# Patient Record
Sex: Female | Born: 1940 | Race: White | Hispanic: No | Marital: Married | Smoking: Never smoker
Health system: Southern US, Community
[De-identification: ages and names within clinical notes are randomized; demographics above are authoritative.]

## PROBLEM LIST (undated history)

## (undated) DIAGNOSIS — R0600 Dyspnea, unspecified: Secondary | ICD-10-CM

## (undated) DIAGNOSIS — F329 Major depressive disorder, single episode, unspecified: Secondary | ICD-10-CM

## (undated) DIAGNOSIS — R413 Other amnesia: Secondary | ICD-10-CM

## (undated) DIAGNOSIS — I671 Cerebral aneurysm, nonruptured: Secondary | ICD-10-CM

## (undated) DIAGNOSIS — M1990 Unspecified osteoarthritis, unspecified site: Secondary | ICD-10-CM

## (undated) DIAGNOSIS — C801 Malignant (primary) neoplasm, unspecified: Secondary | ICD-10-CM

## (undated) DIAGNOSIS — F32A Depression, unspecified: Secondary | ICD-10-CM

## (undated) DIAGNOSIS — I499 Cardiac arrhythmia, unspecified: Secondary | ICD-10-CM

## (undated) DIAGNOSIS — G4730 Sleep apnea, unspecified: Secondary | ICD-10-CM

## (undated) DIAGNOSIS — R011 Cardiac murmur, unspecified: Secondary | ICD-10-CM

## (undated) DIAGNOSIS — K219 Gastro-esophageal reflux disease without esophagitis: Secondary | ICD-10-CM

## (undated) DIAGNOSIS — M810 Age-related osteoporosis without current pathological fracture: Secondary | ICD-10-CM

## (undated) DIAGNOSIS — M2041 Other hammer toe(s) (acquired), right foot: Secondary | ICD-10-CM

## (undated) DIAGNOSIS — I10 Essential (primary) hypertension: Secondary | ICD-10-CM

## (undated) DIAGNOSIS — I2510 Atherosclerotic heart disease of native coronary artery without angina pectoris: Secondary | ICD-10-CM

## (undated) DIAGNOSIS — E538 Deficiency of other specified B group vitamins: Secondary | ICD-10-CM

## (undated) HISTORY — PX: HEMORROIDECTOMY: SUR656

## (undated) HISTORY — PX: OTHER SURGICAL HISTORY: SHX169

## (undated) HISTORY — PX: BREAST SURGERY: SHX581

## (undated) HISTORY — PX: ABDOMINAL HYSTERECTOMY: SHX81

## (undated) HISTORY — PX: BLADDER SURGERY: SHX569

## (undated) HISTORY — PX: BRAIN SURGERY: SHX531

## (undated) HISTORY — PX: FOOT SURGERY: SHX648

## (undated) HISTORY — PX: SALPINGOOPHORECTOMY: SHX82

## (undated) HISTORY — PX: APPENDECTOMY: SHX54

## (undated) HISTORY — PX: CHOLECYSTECTOMY: SHX55

## (undated) HISTORY — PX: TONSILLECTOMY: SUR1361

---
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
## 1970-01-31 HISTORY — PX: ABDOMINAL HYSTERECTOMY: SHX81

## 2000-02-01 HISTORY — PX: BREAST SURGERY: SHX581

## 2002-12-29 ENCOUNTER — Other Ambulatory Visit: Payer: Self-pay

## 2003-08-01 ENCOUNTER — Other Ambulatory Visit: Payer: Self-pay

## 2004-02-01 HISTORY — PX: OTHER SURGICAL HISTORY: SHX169

## 2004-02-09 ENCOUNTER — Ambulatory Visit: Payer: Self-pay | Admitting: Unknown Physician Specialty

## 2004-03-22 ENCOUNTER — Ambulatory Visit: Payer: Self-pay | Admitting: Unknown Physician Specialty

## 2004-04-15 ENCOUNTER — Encounter: Payer: Self-pay | Admitting: Internal Medicine

## 2004-05-01 ENCOUNTER — Encounter: Payer: Self-pay | Admitting: Internal Medicine

## 2004-11-11 ENCOUNTER — Ambulatory Visit: Payer: Self-pay | Admitting: Internal Medicine

## 2008-12-16 ENCOUNTER — Inpatient Hospital Stay: Payer: Self-pay | Admitting: Internal Medicine

## 2009-01-01 ENCOUNTER — Ambulatory Visit: Payer: Self-pay | Admitting: Internal Medicine

## 2009-01-31 HISTORY — PX: BLADDER SURGERY: SHX569

## 2009-02-02 ENCOUNTER — Ambulatory Visit: Payer: Self-pay | Admitting: Unknown Physician Specialty

## 2009-02-11 ENCOUNTER — Ambulatory Visit: Payer: Self-pay | Admitting: Unknown Physician Specialty

## 2009-03-27 ENCOUNTER — Ambulatory Visit: Payer: Self-pay | Admitting: Unknown Physician Specialty

## 2010-02-10 ENCOUNTER — Ambulatory Visit: Payer: Self-pay | Admitting: Internal Medicine

## 2011-12-23 ENCOUNTER — Inpatient Hospital Stay: Payer: Self-pay | Admitting: Family Medicine

## 2011-12-23 LAB — BASIC METABOLIC PANEL
Calcium, Total: 9.3 mg/dL (ref 8.5–10.1)
Co2: 27 mmol/L (ref 21–32)
Creatinine: 0.82 mg/dL (ref 0.60–1.30)
EGFR (African American): >60
EGFR (Non-African Amer.): >60
Glucose: 137 mg/dL — ABNORMAL HIGH (ref 65–99)
Potassium: 3.6 mmol/L (ref 3.5–5.1)
Sodium: 140 mmol/L (ref 136–145)

## 2011-12-23 LAB — CBC
HGB: 13.1 g/dL (ref 12.0–16.0)
MCH: 31 pg (ref 26.0–34.0)
MCHC: 33 g/dL (ref 32.0–36.0)

## 2011-12-23 LAB — TROPONIN I: Troponin-I: <0.02 ng/mL

## 2011-12-24 LAB — CK TOTAL AND CKMB (NOT AT ARMC)
CK, Total: 229 U/L — ABNORMAL HIGH (ref 21–215)
CK-MB: 2.4 ng/mL (ref 0.5–3.6)

## 2013-01-22 ENCOUNTER — Ambulatory Visit: Payer: Self-pay | Admitting: Unknown Physician Specialty

## 2013-02-27 ENCOUNTER — Ambulatory Visit: Payer: Self-pay | Admitting: Unknown Physician Specialty

## 2013-03-01 LAB — PATHOLOGY REPORT

## 2013-04-23 ENCOUNTER — Emergency Department: Payer: Self-pay | Admitting: Emergency Medicine

## 2013-07-07 DIAGNOSIS — I10 Essential (primary) hypertension: Secondary | ICD-10-CM | POA: Diagnosis present

## 2014-05-16 ENCOUNTER — Inpatient Hospital Stay
Admit: 2014-05-16 | Disposition: A | Discharge status: Home / Self Care | Payer: Self-pay | Attending: Internal Medicine | Admitting: Internal Medicine

## 2014-05-16 LAB — URINALYSIS, COMPLETE
BACTERIA: NONE SEEN
BLOOD: NEGATIVE
Bilirubin,UR: NEGATIVE
Glucose,UR: NEGATIVE mg/dL (ref 0–75)
KETONE: NEGATIVE
LEUKOCYTE ESTERASE: NEGATIVE
NITRITE: NEGATIVE
PH: 7 (ref 4.5–8.0)
Protein: NEGATIVE
SPECIFIC GRAVITY: 1.002 (ref 1.003–1.030)

## 2014-05-16 LAB — COMPREHENSIVE METABOLIC PANEL
ALT: 24 U/L
Albumin: 4.3 g/dL
Alkaline Phosphatase: 69 U/L
Anion Gap: 8 (ref 7–16)
BILIRUBIN TOTAL: 0.8 mg/dL
BUN: 9 mg/dL
CALCIUM: 9.3 mg/dL
CHLORIDE: 106 mmol/L
Co2: 27 mmol/L
Creatinine: 0.73 mg/dL
Glucose: 107 mg/dL — ABNORMAL HIGH
Potassium: 3.7 mmol/L
SGOT(AST): 31 U/L
Sodium: 141 mmol/L
Total Protein: 7.2 g/dL

## 2014-05-16 LAB — CBC WITH DIFFERENTIAL/PLATELET
Basophil #: 0.1 10*3/uL (ref 0.0–0.1)
Basophil %: 1.2 %
EOS PCT: 1.5 %
Eosinophil #: 0.1 10*3/uL (ref 0.0–0.7)
HCT: 39.9 % (ref 35.0–47.0)
HGB: 13.2 g/dL (ref 12.0–16.0)
LYMPHS ABS: 1.9 10*3/uL (ref 1.0–3.6)
Lymphocyte %: 32.3 %
MCH: 30.7 pg (ref 26.0–34.0)
MCHC: 33.2 g/dL (ref 32.0–36.0)
MCV: 92 fL (ref 80–100)
MONO ABS: 0.4 x10 3/mm (ref 0.2–0.9)
Monocyte %: 7.2 %
NEUTROS PCT: 57.8 %
Neutrophil #: 3.4 10*3/uL (ref 1.4–6.5)
Platelet: 315 10*3/uL (ref 150–440)
RBC: 4.32 10*6/uL (ref 3.80–5.20)
RDW: 14.1 % (ref 11.5–14.5)
WBC: 5.9 10*3/uL (ref 3.6–11.0)

## 2014-05-16 LAB — TROPONIN I: Troponin-I: <0.03 ng/mL

## 2014-05-16 LAB — LIPASE, BLOOD: Lipase: 32 U/L

## 2014-05-17 LAB — BASIC METABOLIC PANEL
Anion Gap: 6 — ABNORMAL LOW (ref 7–16)
BUN: 8 mg/dL
Calcium, Total: 8.6 mg/dL — ABNORMAL LOW
Chloride: 109 mmol/L
Co2: 26 mmol/L
Creatinine: 0.68 mg/dL
EGFR (African American): >60
EGFR (Non-African Amer.): >60
GLUCOSE: 94 mg/dL
Potassium: 3.7 mmol/L
SODIUM: 141 mmol/L

## 2014-05-17 LAB — CBC WITH DIFFERENTIAL/PLATELET
BASOS ABS: 0.1 10*3/uL (ref 0.0–0.1)
BASOS PCT: 1.9 %
Eosinophil #: 0.1 10*3/uL (ref 0.0–0.7)
Eosinophil %: 2.8 %
HCT: 36.3 % (ref 35.0–47.0)
HGB: 12.1 g/dL (ref 12.0–16.0)
Lymphocyte #: 2.2 10*3/uL (ref 1.0–3.6)
Lymphocyte %: 44 %
MCH: 31.1 pg (ref 26.0–34.0)
MCHC: 33.4 g/dL (ref 32.0–36.0)
MCV: 93 fL (ref 80–100)
Monocyte #: 0.4 x10 3/mm (ref 0.2–0.9)
Monocyte %: 8.7 %
Neutrophil #: 2.2 10*3/uL (ref 1.4–6.5)
Neutrophil %: 42.6 %
Platelet: 271 10*3/uL (ref 150–440)
RBC: 3.9 10*6/uL (ref 3.80–5.20)
RDW: 13.7 % (ref 11.5–14.5)
WBC: 5.1 10*3/uL (ref 3.6–11.0)

## 2014-05-20 NOTE — Discharge Summary (Signed)
PATIENT NAME:  Meghan Bell, Meghan Bell MR#:  [id number] DATE OF BIRTH:  01/04/1941  DATE OF ADMISSION:  12/23/2011 DATE OF DISCHARGE:  12/24/2011  DISCHARGE DIAGNOSES:  1. Noncardiac chest pain.  2. Hypertension.  3. Hyperlipidemia.   DISCHARGE MEDICATIONS:  1. Lovastatin 20 mg p.o. at bedtime.  2. Metoprolol succinate 50 mg 1 tab p.o. daily.  3. Aspirin 81 mg p.o. daily.  4. Protonix 40 mg p.o. daily.  5. Vitamin D and calcium supplementation as directed.   CONSULTS: None.   PROCEDURES: She had a Myoview that was negative.   PERTINENT LABS: Cardiac enzymes CK-MB 4.1, 2.4, 1.9. Troponin negative x3. MET-B and CBC all within normal limits. Myoview negative.   BRIEF HOSPITAL COURSE:  1. Noncardiac chest pain. The patient initially came in complaining of chest discomfort. She has had a recurrent issue of this in the past. Last admission was in 2003 where she was ruled out negative. She came in complaining of chest discomfort and was placed on telemetry and cycled cardiac enzymes. Initial CK-MB was slightly elevated at 4.1 but did trend down to 2.4, 1.9. Troponins were negative x3. EKG did not show any significant changes. She underwent a Myoview because of her risk factors and Myoview was negative. Her symptoms improved and she requested to be discharged home so she was discharged home the following day after admission. She is to follow-up with Dr. Anderson in 1 to 2 weeks to reassess.     CONDITION: The patient is in stable condition and will be discharged to home.   ____________________________  , MD kl:drc D: 12/25/2011 07:55:00 ET T: 12/26/2011 10:35:36 ET JOB#: [id number]  cc:  , MD, <Dictator>   MD ELECTRONICALLY SIGNED 01/11/2012 12:17 

## 2014-05-20 NOTE — H&P (Signed)
PATIENT NAME:  Meghan Bell, Meghan Bell MR#:  [id number] DATE OF BIRTH:  26-Oct-1940  DATE OF ADMISSION:  12/23/2011  PRIMARY CARE PHYSICIAN: Dr. Frazier Richards.   CHIEF COMPLAINT: Chest pressure.   HISTORY OF PRESENT ILLNESS:   A 74 year old very pleasant female with a history of hyperlipidemia who presents with the above complaint. The patient says all week on and off she has been having feelings of chest pressure radiating to her left arm associated with some shortness of breath and nausea lasting a couple of minutes, worse with exertion, somewhat better at rest, but no real specific triggers. However, today she finally decided to get some treatment. She went to [facility] where she found that her blood pressure systolic was 778 and normal systolic blood pressure runs between 95 to 105. She saw the nurse practitioner at [facility] who asked her to come to the ER for further evaluation. In the ER, her EKG shows sinus tele. She does not complain of any chest pressure at this time. Her first set of troponins are negative.  REVIEW OF SYSTEMS: CONSTITUTIONAL: No fever, fatigue, or weakness. EYES: No blurred or double vision, glaucoma, or cataracts. ENT: No ear pain, hearing loss or seasonal allergies. RESPIRATORY: No cough, wheezing, hemoptysis, dyspnea, or painful respirations. CARDIOVASCULAR: Positive chest pain/pressure. No orthopnea, edema, arrhythmia, dyspnea on exertion, palpitations, or syncope. GASTROINTESTINAL: No nausea, vomiting, diarrhea, abdominal pain, melena, or ulcers. GU: No dysuria or hematuria. ENDOCRINE: No polyuria or polydipsia. HEME/LYMPH: No anemia or easy bruising. SKIN: No rash or lesions. MUSCULOSKELETAL: No pain in shoulders or knees or limited activity. No arthritis. NEUROLOGIC: No history of cerebrovascular accident, transient ischemic attack, or seizures. PSYCH: No history of anxiety or depression.   PAST MEDICAL HISTORY:  1. Hyperlipidemia.  2. Angina. The patient had a cardiac catheterization  about 15 years ago at [facility].   PAST SURGICAL HISTORY:  1. History of breast lumpectomy.  2. Brain aneurysm coiled in 2005 at [facility].  3. Cardiac catheterization about 15 years ago.  4. Hysterectomy.   ALLERGIES: Macrodantin, sulfa, and erythromycin cause hives. Flu vaccine causes shortness of breath.   SOCIAL HISTORY: NO Tobacco, alcohol or drug use.    FAMILY HISTORY: Father had four myocardial infarctions. Mother passed away at age 11.   PHYSICAL EXAMINATION:  VITAL SIGNS: The patient is afebrile, temperature 98.3, pulse 83, respirations 17, blood pressure 115/67 and saturation 95% on room air.   GENERAL: The patient is alert and oriented, not in acute distress.  HEENT: Head is atraumatic. Pupils are round and reactive. Sclerae anicteric. Mucous membranes are moist. Oropharynx is clear.   NECK: Supple. No JVD, carotid bruit or enlarged thyroid.   CARDIOVASCULAR: Regular rate and rhythm. No murmurs, gallops, or rubs. PMI is not displaced.   LUNGS: Clear to auscultation without crackles, rales, rhonchi, or wheezing. Normal to percussion.   ABDOMEN: Bowel sounds are positive. Nontender, nondistended. No hepatosplenomegaly.   EXTREMITIES: No clubbing, cyanosis, or edema.   NEUROLOGIC: Cranial nerves II through XII are intact. There are no focal deficits.   SKIN: Without rash or lesions.   LABORATORY, DIAGNOSTIC, AND RADIOLOGICAL DATA: Troponin is less than 0.02, CK 351, CPK-MB 4.1. Sodium 140, potassium 3.6, chloride 106, bicarbonate 27, BUN 12, creatinine 0.82, glucose 137. White blood cells 6.8, hemoglobin 13.1, hematocrit 39.7, platelets 300. EKG: Sinus tachycardia. No ST elevation or depression.   ASSESSMENT AND PLAN: The patient is a 74 year old female who presents with classic unstable angina.  1. Unstable angina. The patient  will be admitted to telemetry. We will continue to cycle her cardiac enzymes. It does appear that troponins are normal, however, CK and CPK-MB are  elevated. Therefore with the classic symptoms and elevation in cardiac markers, I will go ahead and place her on full dose of Lovenox at 1 mg/kg subcutaneous q.12 hours. Also, continue to cycle cardiac enzymes. Continue aspirin, beta blocker and statin.  2. Hyperlipidemia. Will continue statin.  3. Elevated blood pressure. Will monitor.   CODE STATUS: The patient is FULL CODE status.   TIME SPENT: Approximately 45 minutes.    ____________________________ Donell Beers. Benjie Karvonen, MD spm:ap D: 12/23/2011 22:13:11 ET T: 12/24/2011 07:47:58 ET JOB#: [id number]  cc: Shivam Mestas P. Benjie Karvonen, MD, <Dictator> Ocie Cornfield. Ouida Sills, MD Donell Beers Matai Carpenito MD ELECTRONICALLY SIGNED 12/25/2011 19:29

## 2014-05-23 DIAGNOSIS — I25119 Atherosclerotic heart disease of native coronary artery with unspecified angina pectoris: Secondary | ICD-10-CM | POA: Diagnosis present

## 2014-06-01 NOTE — H&P (Signed)
PATIENT NAME:  Meghan Bell, TRAMMEL MR#:  [id number] DATE OF BIRTH:  05/05/1940  DATE OF ADMISSION:  05/16/2014  CHIEF COMPLAINT:   Chest pain.   HISTORY OF PRESENT ILLNESS: A 74 year old female with history of hypertension, hyperlipidemia who presented to the clinic today complaining of approximately 5 days of intermittent chest pain. This has been substernal with radiation to her back. Initially was also associated with nausea and 1 episode of vomiting on 05/10/2014. She noted improvement over the next several days and then over the last 3 days or so she has had more constant pain, same location, with radiation to her back associated with occasional nausea. She has noted increasing symptoms with activity as well as with eating. She denies current shortness of breath or fevers. She has occasional cough. She also reports occasional sweating episodes not directly related to the chest pain. She was seen in the clinic today and had a normal EKG, followed by a stress echocardiogram which had no EKG changes but patient reported increasing chest pain and arm pain with exertion. This stress test was cut short and the patient will be admitted for further evaluation  by cardiology.  She does have a history of reflux disease and takes Protonix daily.   PAST MEDICAL HISTORY: 1. Hypertension.  2. Hyperlipidemia.  3. Gastroesophageal reflux disease.  4. Osteoporosis.  5. Pernicious anemia.  6. B12 deficiency.  7. Palpitations.  8. Nonmelanoma skin cancer.  9. Cholelithiasis.  10. Liver mass, stable since 1998.  11. Fibrocystic breast disease.  12. Osteoarthritis.   PAST SURGICAL HISTORY: 1. Hysterectomy.  2. Left breast surgery 2002.  3. Bladder surgery 2011.  4. Foot surgery 2005.  5. Rotator cuff surgery 2006.  6. Tonsillectomy.  7. Appendectomy.  8. Hemorrhoidectomy.  9. Oophorectomy.   SOCIAL HISTORY: Married. Nonsmoker.   FAMILY HISTORY: Noncontributory.   REVIEW OF SYSTEMS:  As per HPI.    PHYSICAL EXAMINATION: GENERAL: No acute distress.  HEENT:  Head is normocephalic, atraumatic.  Full exam not completed due to circumstances.  LUNGS: Exam shows bilateral clear lungs without crackles or wheezing.  HEART:  Regular rate and rhythm. No murmurs, rubs, or gallops. Chest wall nontender.  ABDOMEN: Soft and nontender.  NEUROLOGIC: Nonfocal.  SKIN: Warm and dry.   MEDICATIONS:  Aspirin 81 mg 2 tablets daily, calcium with vitamin D 1 tablet daily, Benadryl 25 mg p.r.n., Mevacor 20 mg daily, metoprolol 50 mg daily, Protonix 40 mg daily, Tylenol p.r.n.   ALLERGIES: AMOXICILLIN, ERYTHROMYCIN, VACCINES, SULFA, NITROFURANTOIN.   ASSESSMENT AND PLAN: 1. Chest pain, atypical. Worsening symptoms today in the clinic while performing stress echocardiogram. No significant EKG changes observed. We will further evaluate in hospital setting with consultation  for cardiology. Obtain x-ray of the abdomen. Check labs including CBC, met c, and troponin. Continue Protonix.  2. Hypertension. Continue metoprolol.  3. Hyperlipidemia. Continue lovastatin.  4. Osteoporosis. Continue calcium.    ____________________________ Marily Lente. Vidal Schwalbe, PA-C rjt:tr D: 05/16/2014 12:49:26 ET T: 05/16/2014 13:07:05 ET JOB#: [id number]  cc: Marily Lente. Lolita Lenz, <Dictator> Leonard Downing PA ELECTRONICALLY SIGNED 05/23/2014 19:49

## 2014-06-01 NOTE — Discharge Summary (Signed)
Dates of Admission and Diagnosis:  Date of Admission 16-May-2014   Date of Discharge 17-May-2014   Admitting Diagnosis Chest pain, Unstable angina, hypertension, hyperlipidemia, cerebral aneurysm   Final Diagnosis Chest pain, MI ruled out, hypertension, hyperlipidemia, cerebral aneurysm   Discharge Diagnosis 1 Chest pain, MI ruled out   2 Hypertension   3 Hyperlipidemia,   4 Cerebral aneurysm    Chief Complaint/History of Present Illness 74 year old lady with history of hypertension, hyperlipidemia and cerebral aneurysm followed at [facility]. She presented with intermittent, substernal, chest pain, radiating to the back, associated with some left arm discomfort. She also had some nausea and vomiting x 1. Patient was evaluated in the clinic yesterday for chest pain. She had an abnormal acute EKG and abnormal stress test. Patient was hospitalized for further management.   Allergies:  Sulfa drugs: Hives  Flu vaccine: Hives  Erythromycin: Hives  PCN: Rash  Macrodantin: Other    Routine Chem:  16-Apr-16 04:01   Glucose, Serum 94 (65-99 NOTE: New Reference Range  04/08/14)  BUN 8 (6-20 NOTE: New Reference Range  04/08/14)  Creatinine (comp) 0.68 (0.44-1.00 NOTE: New Reference Range  04/08/14)  Sodium, Serum 141 (135-145 NOTE: New Reference Range  04/08/14)  Potassium, Serum 3.7 (3.5-5.1 NOTE: New Reference Range  04/08/14)  Chloride, Serum 109 (101-111 NOTE: New Reference Range  04/08/14)  CO2, Serum 26 (22-32 NOTE: New Reference Range  04/08/14)  Calcium (Total), Serum  8.6 (8.9-10.3 NOTE: New Reference Range  04/08/14)  Anion Gap  6  eGFR (African American) >60  eGFR (Non-African American) >60 (eGFR values <46m/min/1.73 m2 may be an indication of chronic kidney disease (CKD). Calculated eGFR is useful in patients with stable renal function. The eGFR calculation will not be reliable in acutely ill patients when serum creatinine is changing rapidly. It is not useful  in patients on dialysis. The eGFR calculation may not be applicable to patients at the low and high extremes of body sizes, pregnant women, and vegetarians.)  Cardiac:  15-Apr-16 14:00   Troponin I <0.03 (0.00-0.03 0.03 ng/mL or less: NEGATIVE  Repeat testing in 3-6 hrs  if clinically indicated. >0.05 ng/mL: POTENTIAL  MYOCARDIAL INJURY. Repeat  testing in 3-6 hrs if  clinically indicated. NOTE: An increase or decrease  of 30% or more on serial  testing suggests a  clinically important change NOTE: New Reference Range  04/08/14)    18:36   Troponin I <0.03 (0.00-0.03 0.03 ng/mL or less: NEGATIVE  Repeat testing in 3-6 hrs  if clinically indicated. >0.05 ng/mL: POTENTIAL  MYOCARDIAL INJURY. Repeat  testing in 3-6 hrs if  clinically indicated. NOTE: An increase or decrease  of 30% or more on serial  testing suggests a  clinically important change NOTE: New Reference Range  04/08/14)    21:56   Troponin I <0.03 (0.00-0.03 0.03 ng/mL or less: NEGATIVE  Repeat testing in 3-6 hrs  if clinically indicated. >0.05 ng/mL: POTENTIAL  MYOCARDIAL INJURY. Repeat  testing in 3-6 hrs if  clinically indicated. NOTE: An increase or decrease  of 30% or more on serial  testing suggests a  clinically important change NOTE: New Reference Range  04/08/14)  Routine Hem:  16-Apr-16 04:01   WBC (CBC) 5.1  RBC (CBC) 3.90  Hemoglobin (CBC) 12.1  Hematocrit (CBC) 36.3  Platelet Count (CBC) 271  MCV 93  MCH 31.1  MCHC 33.4  RDW 13.7  Neutrophil % 42.6  Lymphocyte % 44.0  Monocyte % 8.7  Eosinophil %  2.8  Basophil % 1.9  Neutrophil # 2.2  Lymphocyte # 2.2  Monocyte # 0.4  Eosinophil # 0.1  Basophil # 0.1 (Result(s) reported on 17 May 2014 at 05:18AM.)   PERTINENT RADIOLOGY STUDIES: XRay:    15-Apr-16 16:30, Abdomen 3 Way Includes PA Chest  Abdomen 3 Way Includes PA Chest   REASON FOR EXAM:    chest/abd pain  COMMENTS:       PROCEDURE: DXR - DXR ABDOMEN 3-WAY (INCL PA  CXR)  - May 16 2014  4:30PM     CLINICAL DATA:  Intermittent chest pain and nausea for 5 days    EXAM:  ABDOMEN SERIES    COMPARISON:  CT abdomen and pelvis February 02, 2009    FINDINGS:  PA chest: There is no edema or consolidation. Heart size and  pulmonary vascularity are normal. No adenopathy. No pneumothorax.  Supine and upright abdomen: There is diffuse stool throughout the  colon. There is no bowel dilatation or air-fluid level suggesting  obstruction. No free air. There are scattered calcified mesenteric  lymph nodes.     IMPRESSION:  Diffuse stool throughout colon. Bowel gas pattern unremarkable. No  edema or consolidation.      Electronically Signed    By: Lowella Grip III M.D.    On: 05/16/2014 16:36       Verified By: Leafy Kindle. WOODRUFF, M.D.,   Pertinent Past History:  Pertinent Past History Hypertension Hyperlipidemia Cerebral aneurysm, followed at [facility] Patient was admitted for chest pain. She received beta-blocker, aspirin, statin, nitrates, S/Q Heparin and Protonix. Serial cardiac enzymes are negative. Patient was evaluated by cardiology and advised cardiac cath as out patient. Patient has h/o cerebral aneurysm which is followed by neuro-surgery at [facility]. Patient also has a cardiologist at [facility]. She plan to follow up with her cardiologist at [facility] for cardiac catheterization. Shall continue Toprol, Aspirin, Mevacor, Imdur and Protonix. Use s/l NTG as needed.   Condition on Discharge Satisfactory   DISCHARGE INSTRUCTIONS HOME MEDS:  Medication Reconciliation: Patient's Home Medications at Discharge:     Medication Instructions  aspirin 81 mg oral tablet, chewable  2 tab(s) orally once a day   lovastatin 20 mg oral tablet  1 tab(s) orally once a day   protonix 40 mg oral delayed release tablet  1 tab(s) orally once a day   calcium-vitamin d  1 tab(s) orally once a day   benadryl 25 mg oral tablet  1 tab(s) orally 3  times a day, As Needed for allergies   tylenol 325 mg oral tablet  2 tab(s) orally every 4 hours, As Needed - for Pain   metoprolol succinate 50 mg oral tablet, extended release  1 tab(s) orally once a day   isosorbide mononitrate 30 mg oral tablet, extended release  1 tab(s) orally once a day   nitroglycerin 0.4 mg sublingual tablet  1 tab(s) sublingual , As Needed, chest pain , As needed, chest pain    PRESCRIPTIONS: ELECTRONICALLY SUBMITTED   Physician's Instructions:  Diet Low Sodium  Low Fat, Low Cholesterol   Activity Limitations As tolerated   Return to Work Not Applicable   Time frame for Follow Up Appointment 1-2 weeks  Dr. Ouida Sills   Time frame for Follow Up Appointment 1-2 weeks  [facility]   Other Comments Patient will follow up with her cardiologist at [facility] for cardiac catheterization.     Frazier Richards W(Family Physician): [facility], [phone]  [address], [place], [place] [address], [place] [phone]  Electronic Signatures: Glendon Axe (MD)  (Signed 16-Apr-16 15:01)  Authored: ADMISSION DATE AND DIAGNOSIS, CHIEF COMPLAINT/HPI, Allergies, PERTINENT LABS, PERTINENT RADIOLOGY STUDIES, PERTINENT PAST HISTORY, HOSPITAL COURSE, DISCHARGE INSTRUCTIONS HOME MEDS, PATIENT INSTRUCTIONS, Follow Up Physician   Last Updated: 16-Apr-16 15:01 by Glendon Axe (MD)

## 2014-06-01 NOTE — Consult Note (Signed)
PATIENT NAME:  Meghan Bell, KATES MR#:  [id number] DATE OF BIRTH:  1940-10-24  DATE OF CONSULTATION:  05/16/2014  REFERRING PHYSICIAN:  Marily Lente. Lolita Lenz, for Adin Hector, MD CONSULTING PHYSICIAN:  Collier Monica D. Clayborn Bigness, MD  PRIMARY CARE PHYSICIAN: Ocie Cornfield. Ouida Sills, MD  INDICATION: Chest pain, abnormal stress test.   HISTORY OF PRESENT ILLNESS: The patient is a 74 year old female history of hypertension, hyperlipidemia, presents with complaint of 5 days of intermittent chest pain. She also noticed some left arm discomfort, substernal radiating to her back. She also had some nausea, vomiting x 1. The patient noted improvement, over several days but then returned again with recurrent symptoms with activity including eating. Denied any shortness of breath or fevers. Had occasional cough with some sweating episodes. Was seen in the clinic, had an abnormal acute EKG followed by a stress echocardiogram. No EKG changes but the patient reported increasing chest pain, arm pain with exertion and stress test was cut short. She was admitted for further evaluation and management.   PAST MEDICAL HISTORY: Hypertension, hyperlipidemia, reflux, osteoporosis, pernicious anemia, B12 deficiency, palpitations, skin cancer, cholelithiasis, liver mass, fibrocystic breast disease, osteoarthritis.   PAST SURGICAL HISTORY: Hysterectomy, left breast surgery, bladder surgery, foot surgery, rotator cuff surgery, tonsillectomy, appendectomy, hemorrhoidectomy, oophorectomy.   SOCIAL HISTORY: Married. The patient is retired. No smoking. No alcohol consumption.   FAMILY HISTORY: Noncontributory.   REVIEW OF SYSTEMS: Denies blackout spells or syncope. No nausea or vomiting. No fever. No chills. No sweats. No weight loss. No weight gain. No hemoptysis or hematemesis. No bright red blood per rectum. She complained of chest pain, shortness of breath, arm pain, nausea, some vomiting.   PHYSICAL EXAMINATION:  VITAL SIGNS:  Blood pressure was 140/70, pulse of 80, respiratory rate 16, afebrile.  HEENT: Normocephalic, atraumatic. Pupils equal and reactive to light.  NECK: Supple. No significant JVD, bruits, or adenopathy.  LUNGS: Clear to auscultation and percussion. No wheezing, rhonchi, or rales.  HEART: Regular rate and rhythm.  ABDOMEN: Benign.  EXTREMITIES: Within normal limits.  NEUROLOGIC: Intact. SKIN: Normal.  MEDICATIONS: Aspirin 81 mg a day, calcium plus D once a day, Benadryl 25 mg p.r.n., Mevacor 20 mg a day, metoprolol 50 daily, Protonix 40 a day, Tylenol p.r.n.   ALLERGIES: AMOXICILLIN, ERYTHROMYCIN, , SULFA, NITROFURANTOIN. Flu vaccine  ASSESSMENT: Chest pain, unstable angina with abnormal EKG, abnormal stress test, hypertension, obesity, hyperlipidemia.   RECOMMENDATIONS:  1.  Agree admit.  2.  Rule out for myocardial infarction. Follow up cardiac enzymes. Followup EKG. Continue telemetry.  3.  Maintain blood pressure control.  4.  Maintain metoprolol therapy for angina and hypertension.  5.  Hyperlipidemia. Continue Mevacor.  6.  GERD. Agree with the Protonix therapy.  7.  Continue aspirin for arteriosclerotic vascular disease.  8.  If the patient continued to have symptoms, would recommend an invasive strategy. I see she just had a functional study. Would recommend cardiac catheterization for further evaluation and management of symptoms.    ____________________________ Loran Senters Clayborn Bigness, MD ddc:bm D: 05/17/2014 00:35:14 ET T: 05/17/2014 04:08:06 ET JOB#: [id number]  cc: Ceriah Kohler D. Clayborn Bigness, MD, <Dictator> Yolonda Kida MD ELECTRONICALLY SIGNED 05/17/2014 9:33

## 2014-06-01 NOTE — Consult Note (Signed)
Chief Complaint:  Subjective/Chief Complaint patient denies any further anginal symptoms no shortness of breath resting comfortably in bed. Would prefer outpatient evaluation if it is safe and reasonable.   VITAL SIGNS/ANCILLARY NOTES: **Vital Signs.:   16-Apr-16 07:30  Vital Signs Type Routine  Temperature Temperature (F) 97.4  Celsius 36.3  Temperature Source oral  Pulse Pulse 67  Respirations Respirations 19  Systolic BP Systolic BP 703  Diastolic BP (mmHg) Diastolic BP (mmHg) 75  Mean BP 88  Pulse Ox % Pulse Ox % 98  Pulse Ox Activity Level  At rest  Oxygen Delivery Room Air/ 21 %  *Intake and Output.:   Daily 16-Apr-16 07:00  Grand Totals Intake:  960 Output:  2200    Net:  -1240 24 Hr.:  -1240  Oral Intake      In:  760  IV (Primary)      In:  200  Urine ml     Out:  2200  Length of Stay Totals Intake:  960 Output:  2200    Net:  -1240   Brief Assessment:  GEN well developed, well nourished, no acute distress   Cardiac Regular  murmur present   Respiratory normal resp effort  clear BS   Gastrointestinal Normal   Gastrointestinal details normal Soft  Nontender  Nondistended   EXTR negative cyanosis/clubbing, negative edema   Lab Results: Routine Chem:  16-Apr-16 04:01   Glucose, Serum 94 (65-99 NOTE: New Reference Range  04/08/14)  BUN 8 (6-20 NOTE: New Reference Range  04/08/14)  Creatinine (comp) 0.68 (0.44-1.00 NOTE: New Reference Range  04/08/14)  Sodium, Serum 141 (135-145 NOTE: New Reference Range  04/08/14)  Potassium, Serum 3.7 (3.5-5.1 NOTE: New Reference Range  04/08/14)  Chloride, Serum 109 (101-111 NOTE: New Reference Range  04/08/14)  CO2, Serum 26 (22-32 NOTE: New Reference Range  04/08/14)  Calcium (Total), Serum  8.6 (8.9-10.3 NOTE: New Reference Range  04/08/14)  Anion Gap  6  eGFR (African American) >60  eGFR (Non-African American) >60 (eGFR values <26m/min/1.73 m2 may be an indication of chronic kidney disease  (CKD). Calculated eGFR is useful in patients with stable renal function. The eGFR calculation will not be reliable in acutely ill patients when serum creatinine is changing rapidly. It is not useful in patients on dialysis. The eGFR calculation may not be applicable to patients at the low and high extremes of body sizes, pregnant women, and vegetarians.)  Routine Hem:  16-Apr-16 04:01   WBC (CBC) 5.1  RBC (CBC) 3.90  Hemoglobin (CBC) 12.1  Hematocrit (CBC) 36.3  Platelet Count (CBC) 271  MCV 93  MCH 31.1  MCHC 33.4  RDW 13.7  Neutrophil % 42.6  Lymphocyte % 44.0  Monocyte % 8.7  Eosinophil % 2.8  Basophil % 1.9  Neutrophil # 2.2  Lymphocyte # 2.2  Monocyte # 0.4  Eosinophil # 0.1  Basophil # 0.1 (Result(s) reported on 17 May 2014 at 05:18AM.)   Radiology Results: XRay:    15-Apr-16 16:30, Abdomen 3 Way Includes PA Chest  Abdomen 3 Way Includes PA Chest   REASON FOR EXAM:    chest/abd pain  COMMENTS:       PROCEDURE: DXR - DXR ABDOMEN 3-WAY (INCL PA CXR)  - May 16 2014  4:30PM     CLINICAL DATA:  Intermittent chest pain and nausea for 5 days    EXAM:  ABDOMEN SERIES    COMPARISON:  CT abdomen and pelvis February 02, 2009  FINDINGS:  PA chest: There is no edema or consolidation. Heart size and  pulmonary vascularity are normal. No adenopathy. No pneumothorax.  Supine and upright abdomen: There is diffuse stool throughout the  colon. There is no bowel dilatation or air-fluid level suggesting  obstruction. No free air. There are scattered calcified mesenteric  lymph nodes.     IMPRESSION:  Diffuse stool throughout colon. Bowel gas pattern unremarkable. No  edema or consolidation.      Electronically Signed    By: Lowella Grip III M.D.    On: 05/16/2014 16:36       Verified By: Leafy Kindle. WOODRUFF, M.D.,   Assessment/Plan:  Assessment/Plan:  Assessment IMP  angina  chest pain symptoms  GERD  hypertension  mild obesity  hyperlipidemia   abnormal EKG  osteoporosis .   Plan PLAN  aspirin therapy 81 mg a day  continue Mevacor for statin therapy  continue Protonix for reflux symptom  start low-dose metoprolol for possible anginal  symptoms  recommend Imdur or 30 mg once a day for possible angina  recommend cardiac catheterization within the next few days  consider performing as an outpatient patient states reasonably asymptomatic and CP ambulate without further symptoms  cardiac catheterization can be done at [facility] or [facility] at the patient  choosing  would strongly recommend procedure be done within the next few days  ambulate in hall to be sure the patient states reasonably asymptomatic with activity   Electronic Signatures: Lujean Amel D (MD)  (Signed 16-Apr-16 08:48)  Authored: Chief Complaint, VITAL SIGNS/ANCILLARY NOTES, Brief Assessment, Lab Results, Radiology Results, Assessment/Plan   Last Updated: 16-Apr-16 08:48 by Yolonda Kida (MD)

## 2015-11-23 DIAGNOSIS — G4733 Obstructive sleep apnea (adult) (pediatric): Secondary | ICD-10-CM

## 2016-09-21 DIAGNOSIS — F411 Generalized anxiety disorder: Secondary | ICD-10-CM | POA: Diagnosis present

## 2016-10-14 ENCOUNTER — Encounter: Payer: Self-pay | Admitting: Emergency Medicine

## 2016-10-14 ENCOUNTER — Emergency Department: Payer: Medicare Other

## 2016-10-14 DIAGNOSIS — F419 Anxiety disorder, unspecified: Secondary | ICD-10-CM | POA: Insufficient documentation

## 2016-10-14 DIAGNOSIS — R0789 Other chest pain: Secondary | ICD-10-CM | POA: Insufficient documentation

## 2016-10-14 LAB — BASIC METABOLIC PANEL
Anion gap: 9 (ref 5–15)
BUN: 13 mg/dL (ref 6–20)
CO2: 25 mmol/L (ref 22–32)
CREATININE: 0.85 mg/dL (ref 0.44–1.00)
Calcium: 9.6 mg/dL (ref 8.9–10.3)
Chloride: 107 mmol/L (ref 101–111)
GFR calc Af Amer: >60 mL/min (ref >60)
Glucose, Bld: 143 mg/dL — ABNORMAL HIGH (ref 65–99)
Potassium: 3.8 mmol/L (ref 3.5–5.1)
Sodium: 141 mmol/L (ref 135–145)

## 2016-10-14 LAB — CBC
HCT: 40.5 % (ref 35.0–47.0)
Hemoglobin: 13.7 g/dL (ref 12.0–16.0)
MCH: 32.6 pg (ref 26.0–34.0)
MCHC: 34 g/dL (ref 32.0–36.0)
MCV: 95.9 fL (ref 80.0–100.0)
Platelets: 319 10*3/uL (ref 150–440)
RBC: 4.22 MIL/uL (ref 3.80–5.20)
RDW: 14.2 % (ref 11.5–14.5)
WBC: 7.3 10*3/uL (ref 3.6–11.0)

## 2016-10-14 LAB — TROPONIN I: Troponin I: <0.03 ng/mL (ref <0.03)

## 2016-10-14 NOTE — ED Triage Notes (Signed)
Pt presents via POV c/o anxiety attacks x3 today. Has hx same. Reports chest pain.

## 2016-10-15 ENCOUNTER — Emergency Department
Admission: EM | Admit: 2016-10-15 | Discharge: 2016-10-15 | Disposition: A | Discharge status: Home / Self Care | Payer: Medicare Other | Attending: Emergency Medicine | Admitting: Emergency Medicine

## 2016-10-15 DIAGNOSIS — R0789 Other chest pain: Secondary | ICD-10-CM

## 2016-10-15 DIAGNOSIS — F419 Anxiety disorder, unspecified: Secondary | ICD-10-CM

## 2016-10-15 MED ORDER — DIAZEPAM 5 MG PO TABS
5.0000 mg | ORAL_TABLET | Freq: Once | ORAL | Status: AC
  Administered 2016-10-15: 5 mg via ORAL
  Filled 2016-10-15: qty 1

## 2016-10-15 MED ORDER — ALPRAZOLAM 0.5 MG PO TABS: 0.5000 mg | ORAL_TABLET | Freq: Two times a day (BID) | ORAL | 0 refills | Status: AC | PRN

## 2016-10-15 NOTE — ED Notes (Signed)
Pt states that she was having tightness in chest and felt weak. Started earlier today. Family at bedside.

## 2016-10-15 NOTE — Discharge Instructions (Signed)
Return to the ER immediately for new or worsening chest discomfort, palpitations, difficulty breathing, or new or worsening anxiety, or any thoughts of not wanting to live, wanting to hurt yourself, or hurt anyone else.  Follow up with the psychiatrist as scheduled.  You may take the prescribed medication only as needed for acute attacks of anxiety.

## 2016-10-15 NOTE — ED Notes (Signed)
Pt ambulating outside with steady gait. NAD noted at this time.

## 2016-10-15 NOTE — ED Provider Notes (Signed)
[facility] Emergency Department Provider Note ____________________________________________   First MD Initiated Contact with Patient 10/15/16 0214     (approximate)  I have reviewed the triage vital signs and the nursing notes.   HISTORY  Chief Complaint Anxiety    HPI Meghan Bell is a 76 y.o. female no prior cardiac history who presents with anxiety over the last few months, intermittent, associated with certain recent family stressors, and today associated with chest pain. Patient and her husband state that over the last few months she has had increased anxiety, and occasionally has acute episodes where she becomes very anxious, restless and upset.  they have had some issues getting her follow-up with a psychiatrist but she is now scheduled for an outpatient psychiatry appointment next month.  In the meantime her primary care doctor started her on an antidepressant and she states she has felt somewhat better over the last 3 weeks. Patient states that tonight after she was feeling anxious for some time she started to feel palpitations and felt tightness in her chest. This concerned her and her husband and they said to come to the emergency department. Patient states that the chest discomfort is now resolved but she still feels anxious.  Patient states that previously, most recently approximately a month ago, before she started on an antidepressant, she did have vague thoughts of not wanting to live. Patient states that this time she has no thoughts of wanting to hurt herself or anywhere else and feels like she wants to live. Husband also corroborates that he does not feel patient is a danger to herself or others.  No past medical history on file.  There are no active problems to display for this patient.   No past surgical history on file.  Prior to Admission medications   Not on File    Allergies Erythromycin and Sulfur  No family history on  file.  Social History Social History  Substance Use Topics  . Smoking status: Not on file  . Smokeless tobacco: Not on file  . Alcohol use Not on file    Review of Systems  Constitutional: No fever/chills Eyes: No visual changes. ENT: No sore throat. Cardiovascular: Positive for chest discomfort. Respiratory: Denies shortness of breath. Gastrointestinal: No nausea, no vomiting.  No diarrhea.  Genitourinary: Negative for dysuria.  Musculoskeletal: Negative for back pain. Skin: Negative for rash. Neurological: Negative for headache.   ____________________________________________   PHYSICAL EXAM:  VITAL SIGNS: ED Triage Vitals  Enc Vitals Group     BP 10/14/16 2251 (!) 192/100     Pulse Rate 10/14/16 2251 (!) 106     Resp 10/14/16 2251 20     Temp 10/14/16 2251 98 F (36.7 C)     Temp Source 10/14/16 2251 Oral     SpO2 10/14/16 2251 97 %     Weight 10/14/16 2255 145 lb (65.8 kg)     Height 10/14/16 2255 5\' 2"  (1.575 m)     Head Circumference --      Peak Flow --      Pain Score 10/14/16 2250 8     Pain Loc --      Pain Edu? --      Excl. in [place]? --     Constitutional: Alert and oriented. Anxious appearing and in no acute distress. Eyes: Conjunctivae are normal.  Head: Atraumatic. Nose: No congestion/rhinnorhea. Mouth/Throat: Mucous membranes are moist.   Neck: Normal range of motion.  Cardiovascular: Normal rate,  regular rhythm. Grossly normal heart sounds.  Good peripheral circulation.  Respiratory: Normal respiratory effort.  No retractions. Lungs CTAB. Gastrointestinal: No distention.  Genitourinary: No CVA tenderness. Musculoskeletal: No lower extremity edema.  Extremities warm and well perfused.  Neurologic:  Normal speech and language. No gross focal neurologic deficits are appreciated.  Skin:  Skin is warm and dry. No rash noted. Psychiatric: Mood and affect are normal. Speech and behavior are normal.  ____________________________________________    LABS (all labs ordered are listed, but only abnormal results are displayed)  Labs Reviewed  BASIC METABOLIC PANEL - Abnormal; Notable for the following:       Result Value   Glucose, Bld 143 (*)    All other components within normal limits  CBC  TROPONIN I   ____________________________________________  EKG  ED ECG REPORT I, Arta Silence, the attending physician, personally viewed and interpreted this ECG.  Date: 10/15/2016 EKG Time: 2250 Rate: 102 Rhythm: sinus tachycardia QRS Axis: normal Intervals: normal ST/T Wave abnormalities: normal Narrative Interpretation: no evidence of acute ischemia; no significant change when compared to EKG of 12/23/11  ____________________________________________  RADIOLOGY  Chest X-ray with bibasilar atelectasis, no other acute findings.   ____________________________________________   PROCEDURES  Procedure(s) performed: No    Critical Care performed: No ____________________________________________   INITIAL IMPRESSION / ASSESSMENT AND PLAN / ED COURSE  Pertinent labs & imaging results that were available during my care of the patient were reviewed by me and considered in my medical decision making (see chart for details).  83.-year-old female presents with primary complaint of anxiety, and today it was associated with chest discomfort which is now resolved. On exam, patient is slightly anxious but comfortable appearing, vital signs are normal except for hypertension, and the exam is otherwise unremarkable. EKG with no ischemic or other concerning findings. Overall presentation is consistent with acute on chronic anxiety.  There is no e/o primary cardiac cause given normal EKG, negative troponin and labs, and association of the chest discomfort with anxiety.    Although patient did endorse some vague SI occurring a month ago before she was placed on an antidepressant, she is adamant that at this time she does not have any  thoughts of wanting to hurt herself or anyone else, and she feels safe. Patient's husband also feels that she is safe at this time. They have psychiatry follow-up arranged for next month.  They expressed some frustration the patient has had continued episodes of anxiety before they have been able to follow-up.  I had extensive discussion with patient and husband; I offered telepsych consultation but they declined, and at this time there is no evidence of danger to self or others or specific indication for emergent psych eval.  I will give low dose valium for symptom control and reassess.  I explained that although we do not typically rx benzos from the ED for long term sx, given that pt has follow up in place and is already on a standing medication, I would be willing to give rx for small amt of short-acting benzo for symptom control when pt has an acute episode.  I also gave extensive return precautions, both for chest pain and palpitations and for any worsening anxiety, SI or HI or other acute psych symptoms.     ----------------------------------------- 4:26 AM on 10/15/2016 -----------------------------------------  Patient states he feels better and would like to go home. Return precautions reiterated.  ____________________________________________   FINAL CLINICAL IMPRESSION(S) / ED DIAGNOSES  Final diagnoses:  Anxiety  Chest discomfort      NEW MEDICATIONS STARTED DURING THIS VISIT:  New Prescriptions   No medications on file     Note:  This document was prepared using Dragon voice recognition software and may include unintentional dictation errors.    Arta Silence, MD 10/15/16 [phone]

## 2016-11-28 DIAGNOSIS — Z8679 Personal history of other diseases of the circulatory system: Secondary | ICD-10-CM

## 2016-11-28 DIAGNOSIS — Z9889 Other specified postprocedural states: Secondary | ICD-10-CM

## 2016-12-05 DIAGNOSIS — E118 Type 2 diabetes mellitus with unspecified complications: Secondary | ICD-10-CM | POA: Diagnosis present

## 2017-11-14 ENCOUNTER — Encounter: Payer: Self-pay | Admitting: *Deleted

## 2017-11-21 ENCOUNTER — Ambulatory Visit: Payer: Medicare Other | Admitting: Anesthesiology

## 2017-11-21 ENCOUNTER — Other Ambulatory Visit: Payer: Self-pay

## 2017-11-21 ENCOUNTER — Encounter
Admission: RE | Disposition: A | Discharge status: Home / Self Care | Payer: Self-pay | Source: Ambulatory Visit | Attending: Ophthalmology

## 2017-11-21 ENCOUNTER — Ambulatory Visit
Admission: RE | Admit: 2017-11-21 | Discharge: 2017-11-21 | Disposition: A | Discharge status: Home / Self Care | Payer: Medicare Other | Source: Ambulatory Visit | Attending: Ophthalmology | Admitting: Ophthalmology

## 2017-11-21 ENCOUNTER — Encounter: Payer: Self-pay | Admitting: Anesthesiology

## 2017-11-21 DIAGNOSIS — M810 Age-related osteoporosis without current pathological fracture: Secondary | ICD-10-CM | POA: Insufficient documentation

## 2017-11-21 DIAGNOSIS — K219 Gastro-esophageal reflux disease without esophagitis: Secondary | ICD-10-CM | POA: Insufficient documentation

## 2017-11-21 DIAGNOSIS — Z881 Allergy status to other antibiotic agents status: Secondary | ICD-10-CM | POA: Diagnosis not present

## 2017-11-21 DIAGNOSIS — H2511 Age-related nuclear cataract, right eye: Secondary | ICD-10-CM | POA: Insufficient documentation

## 2017-11-21 DIAGNOSIS — Z85828 Personal history of other malignant neoplasm of skin: Secondary | ICD-10-CM | POA: Diagnosis not present

## 2017-11-21 DIAGNOSIS — E538 Deficiency of other specified B group vitamins: Secondary | ICD-10-CM | POA: Diagnosis not present

## 2017-11-21 DIAGNOSIS — Z880 Allergy status to penicillin: Secondary | ICD-10-CM | POA: Diagnosis not present

## 2017-11-21 DIAGNOSIS — Z882 Allergy status to sulfonamides status: Secondary | ICD-10-CM | POA: Diagnosis not present

## 2017-11-21 DIAGNOSIS — Z888 Allergy status to other drugs, medicaments and biological substances status: Secondary | ICD-10-CM | POA: Diagnosis not present

## 2017-11-21 DIAGNOSIS — Z7982 Long term (current) use of aspirin: Secondary | ICD-10-CM | POA: Diagnosis not present

## 2017-11-21 DIAGNOSIS — Z79899 Other long term (current) drug therapy: Secondary | ICD-10-CM | POA: Diagnosis not present

## 2017-11-21 DIAGNOSIS — R011 Cardiac murmur, unspecified: Secondary | ICD-10-CM | POA: Diagnosis not present

## 2017-11-21 DIAGNOSIS — E7800 Pure hypercholesterolemia, unspecified: Secondary | ICD-10-CM | POA: Insufficient documentation

## 2017-11-21 HISTORY — DX: Malignant (primary) neoplasm, unspecified: C80.1

## 2017-11-21 HISTORY — DX: Cardiac murmur, unspecified: R01.1

## 2017-11-21 HISTORY — DX: Gastro-esophageal reflux disease without esophagitis: K21.9

## 2017-11-21 HISTORY — DX: Dyspnea, unspecified: R06.00

## 2017-11-21 HISTORY — PX: CATARACT EXTRACTION W/PHACO: SHX586

## 2017-11-21 HISTORY — DX: Cardiac arrhythmia, unspecified: I49.9

## 2017-11-21 SURGERY — PHACOEMULSIFICATION, CATARACT, WITH IOL INSERTION
Anesthesia: Monitor Anesthesia Care | Site: Eye | Laterality: Right

## 2017-11-21 MED ORDER — POVIDONE-IODINE 5 % OP SOLN
OPHTHALMIC | Status: AC
  Filled 2017-11-21: qty 30

## 2017-11-21 MED ORDER — SODIUM CHLORIDE 0.9 % IV SOLN
INTRAVENOUS | Status: DC
  Administered 2017-11-21: 11:00:00 via INTRAVENOUS

## 2017-11-21 MED ORDER — ARMC OPHTHALMIC DILATING DROPS
1.0000 "application " | OPHTHALMIC | Status: AC
  Administered 2017-11-21 (×3): 1 via OPHTHALMIC

## 2017-11-21 MED ORDER — NA CHONDROIT SULF-NA HYALURON 40-17 MG/ML IO SOLN
INTRAOCULAR | Status: AC
  Filled 2017-11-21: qty 1

## 2017-11-21 MED ORDER — LIDOCAINE HCL (PF) 4 % IJ SOLN
INTRAOCULAR | Status: DC | PRN
  Administered 2017-11-21: 2 mL via OPHTHALMIC

## 2017-11-21 MED ORDER — MOXIFLOXACIN HCL 0.5 % OP SOLN
OPHTHALMIC | Status: DC | PRN
  Administered 2017-11-21: .2 mL via OPHTHALMIC

## 2017-11-21 MED ORDER — FENTANYL CITRATE (PF) 100 MCG/2ML IJ SOLN
INTRAMUSCULAR | Status: AC
  Filled 2017-11-21: qty 2

## 2017-11-21 MED ORDER — TETRACAINE HCL 0.5 % OP SOLN
OPHTHALMIC | Status: AC
  Administered 2017-11-21: 1 [drp] via OPHTHALMIC
  Filled 2017-11-21: qty 4

## 2017-11-21 MED ORDER — MIDAZOLAM HCL 2 MG/2ML IJ SOLN
INTRAMUSCULAR | Status: AC
Start: 2017-11-21 — End: 2017-11-21
  Filled 2017-11-21: qty 2

## 2017-11-21 MED ORDER — EPINEPHRINE PF 1 MG/ML IJ SOLN
INTRAMUSCULAR | Status: AC
  Filled 2017-11-21: qty 1

## 2017-11-21 MED ORDER — TETRACAINE HCL 0.5 % OP SOLN
1.0000 [drp] | OPHTHALMIC | Status: AC | PRN
  Administered 2017-11-21 (×3): 1 [drp] via OPHTHALMIC

## 2017-11-21 MED ORDER — CARBACHOL 0.01 % IO SOLN
INTRAOCULAR | Status: DC | PRN
  Administered 2017-11-21: .5 mL via INTRAOCULAR

## 2017-11-21 MED ORDER — MIDAZOLAM HCL 2 MG/2ML IJ SOLN
INTRAMUSCULAR | Status: DC | PRN
  Administered 2017-11-21: 1 mg via INTRAVENOUS

## 2017-11-21 MED ORDER — FENTANYL CITRATE (PF) 100 MCG/2ML IJ SOLN
INTRAMUSCULAR | Status: DC | PRN
  Administered 2017-11-21: 50 ug via INTRAVENOUS

## 2017-11-21 MED ORDER — ARMC OPHTHALMIC DILATING DROPS
OPHTHALMIC | Status: AC
  Administered 2017-11-21: 1 via OPHTHALMIC
  Filled 2017-11-21: qty 0.5

## 2017-11-21 MED ORDER — LIDOCAINE HCL (PF) 4 % IJ SOLN
INTRAMUSCULAR | Status: AC
  Filled 2017-11-21: qty 5

## 2017-11-21 MED ORDER — NA CHONDROIT SULF-NA HYALURON 40-17 MG/ML IO SOLN
INTRAOCULAR | Status: DC | PRN
  Administered 2017-11-21: 1 mL via INTRAOCULAR

## 2017-11-21 MED ORDER — MOXIFLOXACIN HCL 0.5 % OP SOLN: 1.0000 [drp] | OPHTHALMIC | Status: DC | PRN

## 2017-11-21 MED ORDER — EPINEPHRINE PF 1 MG/ML IJ SOLN
INTRAOCULAR | Status: DC | PRN
  Administered 2017-11-21: 1 mL via OPHTHALMIC

## 2017-11-21 MED ORDER — POVIDONE-IODINE 5 % OP SOLN
OPHTHALMIC | Status: DC | PRN
  Administered 2017-11-21: 1 via OPHTHALMIC

## 2017-11-21 MED ORDER — MOXIFLOXACIN HCL 0.5 % OP SOLN
OPHTHALMIC | Status: AC
  Filled 2017-11-21: qty 3

## 2017-11-21 SURGICAL SUPPLY — 16 items
GLOVE BIO SURGEON STRL SZ8 (GLOVE) ×3 IMPLANT
GLOVE BIOGEL M 6.5 STRL (GLOVE) ×3 IMPLANT
GLOVE SURG LX 8.0 MICRO (GLOVE) ×2
GLOVE SURG LX STRL 8.0 MICRO (GLOVE) ×1 IMPLANT
GOWN STRL REUS W/ TWL LRG LVL3 (GOWN DISPOSABLE) ×2 IMPLANT
GOWN STRL REUS W/TWL LRG LVL3 (GOWN DISPOSABLE) ×4
LABEL CATARACT MEDS ST (LABEL) ×3 IMPLANT
LENS IOL TECNIS ITEC 22.5 (Intraocular Lens) ×2 IMPLANT
PACK CATARACT (MISCELLANEOUS) ×3 IMPLANT
PACK CATARACT BRASINGTON LX (MISCELLANEOUS) ×3 IMPLANT
PACK EYE AFTER SURG (MISCELLANEOUS) ×3 IMPLANT
SOL BSS BAG (MISCELLANEOUS) ×3
SOLUTION BSS BAG (MISCELLANEOUS) ×1 IMPLANT
SYR 5ML LL (SYRINGE) ×3 IMPLANT
WATER STERILE IRR 250ML POUR (IV SOLUTION) ×3 IMPLANT
WIPE NON LINTING 3.25X3.25 (MISCELLANEOUS) ×3 IMPLANT

## 2017-11-21 NOTE — Anesthesia Preprocedure Evaluation (Signed)
Anesthesia Evaluation  Patient identified by MRN, date of birth, ID band Patient awake    Reviewed: Allergy & Precautions, NPO status , Patient's Chart, lab work & pertinent test results  History of Anesthesia Complications Negative for: history of anesthetic complications  Airway Mallampati: III  TM Distance: >3 FB Neck ROM: Full    Dental no notable dental hx.    Pulmonary neg sleep apnea, neg COPD,    breath sounds clear to auscultation- rhonchi (-) wheezing      Cardiovascular Exercise Tolerance: Good (-) hypertension(-) CAD, (-) Past MI, (-) Cardiac Stents and (-) CABG  Rhythm:Regular Rate:Normal - Systolic murmurs and - Diastolic murmurs    Neuro/Psych negative neurological ROS  negative psych ROS   GI/Hepatic Neg liver ROS, GERD  ,  Endo/Other  negative endocrine ROSneg diabetes  Renal/GU negative Renal ROS     Musculoskeletal negative musculoskeletal ROS (+)   Abdominal (+) - obese,   Peds  Hematology negative hematology ROS (+)   Anesthesia Other Findings Past Medical History: No date: Cancer ([place])     Comment:  SKIN No date: Dyspnea     Comment:  DOE No date: Dysrhythmia No date: GERD (gastroesophageal reflux disease) No date: Heart murmur   Reproductive/Obstetrics                             Anesthesia Physical Anesthesia Plan  ASA: II  Anesthesia Plan: MAC   Post-op Pain Management:    Induction: Intravenous  PONV Risk Score and Plan: 2 and Midazolam  Airway Management Planned: Natural Airway  Additional Equipment:   Intra-op Plan:   Post-operative Plan:   Informed Consent: I have reviewed the patients History and Physical, chart, labs and discussed the procedure including the risks, benefits and alternatives for the proposed anesthesia with the patient or authorized representative who has indicated his/her understanding and acceptance.     Plan  Discussed with: CRNA and Anesthesiologist  Anesthesia Plan Comments:         Anesthesia Quick Evaluation

## 2017-11-21 NOTE — Anesthesia Procedure Notes (Signed)
Procedure Name: MAC Performed by: Cook-Martin, Lorijean Husser Pre-anesthesia Checklist: Patient identified, Emergency Drugs available, Suction available, Patient being monitored and Timeout performed Patient Re-evaluated:Patient Re-evaluated prior to induction Oxygen Delivery Method: Nasal cannula Preoxygenation: Pre-oxygenation with 100% oxygen Induction Type: IV induction Placement Confirmation: positive ETCO2 and CO2 detector       

## 2017-11-21 NOTE — Discharge Instructions (Addendum)
Eye Surgery Discharge Instructions  Expect mild scratchy sensation or mild soreness. DO NOT RUB YOUR EYE!  The day of surgery:  Minimal physical activity, but bed rest is not required  No reading, computer work, or close hand work  No bending, lifting, or straining.  May watch TV  For 24 hours:  No driving, legal decisions, or alcoholic beverages  Safety precautions  Eat anything you prefer: It is better to start with liquids, then soup then solid foods.  Solar shield eyeglasses should be worn for comfort in the sunlight/patch while sleeping  Resume all regular medications including aspirin or Coumadin if these were discontinued prior to surgery. You may shower, bathe, shave, or wash your hair. Tylenol may be taken for mild discomfort. Follow eye drop instruction sheet as reviewed.  Call your doctor if you experience significant pain, nausea, or vomiting, fever > 101 or other signs of infection. [phone] or [phone] Specific instructions:  Follow-up Information    Birder Robson, MD Follow up.   Specialty:  Ophthalmology Why:  11/22/17 @ 8:30 AM Contact information: [address] [place] [address] [phone]

## 2017-11-21 NOTE — Transfer of Care (Signed)
Immediate Anesthesia Transfer of Care Note  Patient: Meghan Bell  Procedure(s) Performed: CATARACT EXTRACTION PHACO AND INTRAOCULAR LENS PLACEMENT (IOC) (Right Eye)  Patient Location: PACU  Anesthesia Type:MAC  Level of Consciousness: awake, alert  and oriented  Airway & Oxygen Therapy: Patient Spontanous Breathing  Post-op Assessment: Report given to RN and Post -op Vital signs reviewed and stable  Post vital signs: Reviewed and stable  Last Vitals:  Vitals Value Taken Time  BP    Temp    Pulse    Resp    SpO2      Last Pain:  Vitals:   11/21/17 1031  TempSrc: Tympanic  PainSc: 0-No pain         Complications: No apparent anesthesia complications

## 2017-11-21 NOTE — Anesthesia Post-op Follow-up Note (Signed)
Anesthesia QCDR form completed.        

## 2017-11-21 NOTE — Op Note (Signed)
PREOPERATIVE DIAGNOSIS:  Nuclear sclerotic cataract of the right eye.   POSTOPERATIVE DIAGNOSIS:  nuclear sclerotic cataract right eye   OPERATIVE PROCEDURE: Procedure(s): CATARACT EXTRACTION PHACO AND INTRAOCULAR LENS PLACEMENT (IOC)   SURGEON:  Birder Robson, MD.   ANESTHESIA:  Anesthesiologist: Emmie Niemann, MD CRNA: Vaughan Sine  1.      Managed anesthesia care. 2.      0.65ml of Shugarcaine was instilled in the eye following the paracentesis.   COMPLICATIONS:  None.   TECHNIQUE:   Stop and chop   DESCRIPTION OF PROCEDURE:  The patient was examined and consented in the preoperative holding area where the aforementioned topical anesthesia was applied to the right eye and then brought back to the Operating Room where the right eye was prepped and draped in the usual sterile ophthalmic fashion and a lid speculum was placed. A paracentesis was created with the side port blade and the anterior chamber was filled with viscoelastic. A near clear corneal incision was performed with the steel keratome. A continuous curvilinear capsulorrhexis was performed with a cystotome followed by the capsulorrhexis forceps. Hydrodissection and hydrodelineation were carried out with BSS on a blunt cannula. The lens was removed in a stop and chop  technique and the remaining cortical material was removed with the irrigation-aspiration handpiece. The capsular bag was inflated with viscoelastic and the Technis ZCB00  lens was placed in the capsular bag without complication. The remaining viscoelastic was removed from the eye with the irrigation-aspiration handpiece. The wounds were hydrated. The anterior chamber was flushed with Miostat and the eye was inflated to physiologic pressure. 0.61ml of Vigamox was placed in the anterior chamber. The wounds were found to be water tight. The eye was dressed with Vigamox. The patient was given protective glasses to wear throughout the day and a shield with which to  sleep tonight. The patient was also given drops with which to begin a drop regimen today and will follow-up with me in one day. Implant Name Type Inv. Item Serial No. Manufacturer Lot No. LRB No. Used  LENS IOL DIOP 22.5 - [id number] Intraocular Lens LENS IOL DIOP 22.5 [id number] [id number]  Right 1   Procedure(s) with comments: CATARACT EXTRACTION PHACO AND INTRAOCULAR LENS PLACEMENT (IOC) (Right) - [place] 00:41 CDE 5.29 Fluid pack lot # [id number]  Electronically signed: Birder Robson 11/21/2017 11:36 AM

## 2017-11-21 NOTE — Anesthesia Postprocedure Evaluation (Signed)
Anesthesia Post Note  Patient: [place]  Procedure(s) Performed: CATARACT EXTRACTION PHACO AND INTRAOCULAR LENS PLACEMENT (IOC) (Right Eye)  Patient location during evaluation: PACU Anesthesia Type: MAC Level of consciousness: awake and alert and oriented Pain management: pain level controlled Vital Signs Assessment: post-procedure vital signs reviewed and stable Respiratory status: spontaneous breathing, nonlabored ventilation and respiratory function stable Cardiovascular status: blood pressure returned to baseline and stable Postop Assessment: no signs of nausea or vomiting Anesthetic complications: no     Last Vitals:  Vitals:   11/21/17 1031 11/21/17 1137  BP: 125/64 (!) 103/56  Pulse: 89 71  Resp: 17 16  Temp: (!) 35.7 C 36.5 C  SpO2: 91% 97%    Last Pain:  Vitals:   11/21/17 1137  TempSrc: Tympanic  PainSc: 0-No pain                 Kyl Givler

## 2017-11-21 NOTE — H&P (Signed)
All labs reviewed. Abnormal studies sent to patients PCP when indicated.  Previous H&P reviewed, patient examined, there are NO CHANGES.  Meghan Crilly Porfilio10/22/201911:11 AM

## 2017-12-12 ENCOUNTER — Encounter: Payer: Self-pay | Admitting: *Deleted

## 2017-12-19 ENCOUNTER — Ambulatory Visit: Payer: Medicare Other | Admitting: Anesthesiology

## 2017-12-19 ENCOUNTER — Encounter
Admission: RE | Disposition: A | Discharge status: Home / Self Care | Payer: Self-pay | Source: Ambulatory Visit | Attending: Ophthalmology

## 2017-12-19 ENCOUNTER — Other Ambulatory Visit: Payer: Self-pay

## 2017-12-19 ENCOUNTER — Ambulatory Visit
Admission: RE | Admit: 2017-12-19 | Discharge: 2017-12-19 | Disposition: A | Discharge status: Home / Self Care | Payer: Medicare Other | Source: Ambulatory Visit | Attending: Ophthalmology | Admitting: Ophthalmology

## 2017-12-19 ENCOUNTER — Encounter: Payer: Self-pay | Admitting: Emergency Medicine

## 2017-12-19 DIAGNOSIS — Z85828 Personal history of other malignant neoplasm of skin: Secondary | ICD-10-CM | POA: Diagnosis not present

## 2017-12-19 DIAGNOSIS — E7800 Pure hypercholesterolemia, unspecified: Secondary | ICD-10-CM | POA: Diagnosis not present

## 2017-12-19 DIAGNOSIS — Z7982 Long term (current) use of aspirin: Secondary | ICD-10-CM | POA: Insufficient documentation

## 2017-12-19 DIAGNOSIS — E538 Deficiency of other specified B group vitamins: Secondary | ICD-10-CM | POA: Diagnosis not present

## 2017-12-19 DIAGNOSIS — H2512 Age-related nuclear cataract, left eye: Secondary | ICD-10-CM | POA: Insufficient documentation

## 2017-12-19 DIAGNOSIS — M810 Age-related osteoporosis without current pathological fracture: Secondary | ICD-10-CM | POA: Insufficient documentation

## 2017-12-19 DIAGNOSIS — Z79899 Other long term (current) drug therapy: Secondary | ICD-10-CM | POA: Insufficient documentation

## 2017-12-19 HISTORY — PX: CATARACT EXTRACTION W/PHACO: SHX586

## 2017-12-19 SURGERY — PHACOEMULSIFICATION, CATARACT, WITH IOL INSERTION
Anesthesia: Monitor Anesthesia Care | Site: Eye | Laterality: Left

## 2017-12-19 MED ORDER — MOXIFLOXACIN HCL 0.5 % OP SOLN
OPHTHALMIC | Status: AC
  Filled 2017-12-19: qty 3

## 2017-12-19 MED ORDER — CARBACHOL 0.01 % IO SOLN
INTRAOCULAR | Status: DC | PRN
  Administered 2017-12-19: .5 mL via INTRAOCULAR

## 2017-12-19 MED ORDER — NA CHONDROIT SULF-NA HYALURON 40-17 MG/ML IO SOLN
INTRAOCULAR | Status: AC
  Filled 2017-12-19: qty 1

## 2017-12-19 MED ORDER — NA CHONDROIT SULF-NA HYALURON 40-17 MG/ML IO SOLN
INTRAOCULAR | Status: DC | PRN
  Administered 2017-12-19: 1 mL via INTRAOCULAR

## 2017-12-19 MED ORDER — MOXIFLOXACIN HCL 0.5 % OP SOLN: 1.0000 [drp] | OPHTHALMIC | Status: DC | PRN

## 2017-12-19 MED ORDER — MOXIFLOXACIN HCL 0.5 % OP SOLN
OPHTHALMIC | Status: DC | PRN
  Administered 2017-12-19: .2 mL via OPHTHALMIC

## 2017-12-19 MED ORDER — SODIUM CHLORIDE 0.9 % IV SOLN
INTRAVENOUS | Status: DC
  Administered 2017-12-19: 11:00:00 via INTRAVENOUS

## 2017-12-19 MED ORDER — TETRACAINE HCL 0.5 % OP SOLN
1.0000 [drp] | OPHTHALMIC | Status: AC | PRN
  Administered 2017-12-19 (×3): 1 [drp] via OPHTHALMIC

## 2017-12-19 MED ORDER — POVIDONE-IODINE 5 % OP SOLN
OPHTHALMIC | Status: AC
  Filled 2017-12-19: qty 30

## 2017-12-19 MED ORDER — EPINEPHRINE PF 1 MG/ML IJ SOLN
INTRAOCULAR | Status: DC | PRN
  Administered 2017-12-19: 1 mL via OPHTHALMIC

## 2017-12-19 MED ORDER — TETRACAINE HCL 0.5 % OP SOLN
OPHTHALMIC | Status: AC
  Administered 2017-12-19: 1 [drp] via OPHTHALMIC
  Filled 2017-12-19: qty 4

## 2017-12-19 MED ORDER — POVIDONE-IODINE 5 % OP SOLN
OPHTHALMIC | Status: DC | PRN
  Administered 2017-12-19: 1 via OPHTHALMIC

## 2017-12-19 MED ORDER — LIDOCAINE HCL (PF) 4 % IJ SOLN
INTRAOCULAR | Status: DC | PRN
  Administered 2017-12-19: 2 mL via OPHTHALMIC

## 2017-12-19 MED ORDER — ARMC OPHTHALMIC DILATING DROPS
1.0000 "application " | OPHTHALMIC | Status: AC
  Administered 2017-12-19 (×3): 1 via OPHTHALMIC

## 2017-12-19 MED ORDER — LIDOCAINE HCL (PF) 4 % IJ SOLN
INTRAMUSCULAR | Status: AC
  Filled 2017-12-19: qty 5

## 2017-12-19 MED ORDER — MIDAZOLAM HCL 2 MG/2ML IJ SOLN
INTRAMUSCULAR | Status: DC | PRN
  Administered 2017-12-19 (×2): 1 mg via INTRAVENOUS

## 2017-12-19 MED ORDER — ARMC OPHTHALMIC DILATING DROPS
OPHTHALMIC | Status: AC
  Administered 2017-12-19: 1 via OPHTHALMIC
  Filled 2017-12-19: qty 0.5

## 2017-12-19 MED ORDER — MIDAZOLAM HCL 2 MG/2ML IJ SOLN
INTRAMUSCULAR | Status: AC
  Filled 2017-12-19: qty 2

## 2017-12-19 MED ORDER — EPINEPHRINE PF 1 MG/ML IJ SOLN
INTRAMUSCULAR | Status: AC
  Filled 2017-12-19: qty 1

## 2017-12-19 SURGICAL SUPPLY — 16 items
GLOVE BIO SURGEON STRL SZ8 (GLOVE) ×3 IMPLANT
GLOVE BIOGEL M 6.5 STRL (GLOVE) ×3 IMPLANT
GLOVE SURG LX 8.0 MICRO (GLOVE) ×2
GLOVE SURG LX STRL 8.0 MICRO (GLOVE) ×1 IMPLANT
GOWN STRL REUS W/ TWL LRG LVL3 (GOWN DISPOSABLE) ×2 IMPLANT
GOWN STRL REUS W/TWL LRG LVL3 (GOWN DISPOSABLE) ×4
LABEL CATARACT MEDS ST (LABEL) ×3 IMPLANT
LENS IOL TECNIS ITEC 22.0 (Intraocular Lens) ×2 IMPLANT
PACK CATARACT (MISCELLANEOUS) ×3 IMPLANT
PACK CATARACT BRASINGTON LX (MISCELLANEOUS) ×3 IMPLANT
PACK EYE AFTER SURG (MISCELLANEOUS) ×3 IMPLANT
SOL BSS BAG (MISCELLANEOUS) ×3
SOLUTION BSS BAG (MISCELLANEOUS) ×1 IMPLANT
SYR 5ML LL (SYRINGE) ×3 IMPLANT
WATER STERILE IRR 250ML POUR (IV SOLUTION) ×3 IMPLANT
WIPE NON LINTING 3.25X3.25 (MISCELLANEOUS) ×3 IMPLANT

## 2017-12-19 NOTE — Op Note (Signed)
PREOPERATIVE DIAGNOSIS:  Nuclear sclerotic cataract of the left eye.   POSTOPERATIVE DIAGNOSIS:  Nuclear sclerotic cataract of the left eye.   OPERATIVE PROCEDURE: Procedure(s): CATARACT EXTRACTION PHACO AND INTRAOCULAR LENS PLACEMENT (IOC)   SURGEON:  Birder Robson, MD.   ANESTHESIA:  Anesthesiologist: Piscitello, Precious Haws, MD CRNA: Eben Burow, CRNA  1.      Managed anesthesia care. 2.     0.71ml of Shugarcaine was instilled following the paracentesis   COMPLICATIONS:  None.   TECHNIQUE:   Stop and chop   DESCRIPTION OF PROCEDURE:  The patient was examined and consented in the preoperative holding area where the aforementioned topical anesthesia was applied to the left eye and then brought back to the Operating Room where the left eye was prepped and draped in the usual sterile ophthalmic fashion and a lid speculum was placed. A paracentesis was created with the side port blade and the anterior chamber was filled with viscoelastic. A near clear corneal incision was performed with the steel keratome. A continuous curvilinear capsulorrhexis was performed with a cystotome followed by the capsulorrhexis forceps. Hydrodissection and hydrodelineation were carried out with BSS on a blunt cannula. The lens was removed in a stop and chop  technique and the remaining cortical material was removed with the irrigation-aspiration handpiece. The capsular bag was inflated with viscoelastic and the Technis ZCB00 lens was placed in the capsular bag without complication. The remaining viscoelastic was removed from the eye with the irrigation-aspiration handpiece. The wounds were hydrated. The anterior chamber was flushed with Miostat and the eye was inflated to physiologic pressure. 0.68ml Vigamox was placed in the anterior chamber. The wounds were found to be water tight. The eye was dressed with Vigamox. The patient was given protective glasses to wear throughout the day and a shield with which to sleep  tonight. The patient was also given drops with which to begin a drop regimen today and will follow-up with me in one day. Implant Name Type Inv. Item Serial No. Manufacturer Lot No. LRB No. Used  LENS IOL DIOP 22.0 - [id number] Intraocular Lens LENS IOL DIOP 22.0 [id number] AMO  Left 1    Procedure(s) with comments: CATARACT EXTRACTION PHACO AND INTRAOCULAR LENS PLACEMENT (IOC) (Left) - [place] 00:33 CDE 3.94 Fluid pack lot # [id number]  Electronically signed: Birder Robson 12/19/2017 11:42 AM

## 2017-12-19 NOTE — Anesthesia Postprocedure Evaluation (Signed)
Anesthesia Post Note  Patient: Meghan Bell  Procedure(s) Performed: CATARACT EXTRACTION PHACO AND INTRAOCULAR LENS PLACEMENT (IOC) (Left Eye)  Patient location during evaluation: Short Stay Anesthesia Type: MAC Level of consciousness: awake and alert, oriented and patient cooperative Pain management: satisfactory to patient Vital Signs Assessment: post-procedure vital signs reviewed and stable Respiratory status: spontaneous breathing, respiratory function stable and nonlabored ventilation Cardiovascular status: blood pressure returned to baseline and stable Postop Assessment: no headache and no apparent nausea or vomiting Anesthetic complications: no     Last Vitals:  Vitals:   12/19/17 1035 12/19/17 1143  BP: 107/69 (!) 91/57  Pulse: 81 68  Resp: 16 14  Temp: (!) 35.7 C (!) 36.1 C  SpO2: 96% 97%    Last Pain:  Vitals:   12/19/17 1143  TempSrc: Temporal  PainSc: 0-No pain                 Eben Burow

## 2017-12-19 NOTE — Anesthesia Post-op Follow-up Note (Signed)
Anesthesia QCDR form completed.        

## 2017-12-19 NOTE — Discharge Instructions (Signed)
Eye Surgery Discharge Instructions    Expect mild scratchy sensation or mild soreness. DO NOT RUB YOUR EYE!  The day of surgery:  Minimal physical activity, but bed rest is not required  No reading, computer work, or close hand work  No bending, lifting, or straining.  May watch TV  For 24 hours:  No driving, legal decisions, or alcoholic beverages  Safety precautions  Eat anything you prefer: It is better to start with liquids, then soup then solid foods.  _____ Eye patch should be worn until postoperative exam tomorrow.  ____ Solar shield eyeglasses should be worn for comfort in the sunlight/patch while sleeping  Resume all regular medications including aspirin or Coumadin if these were discontinued prior to surgery. You may shower, bathe, shave, or wash your hair. Tylenol may be taken for mild discomfort.  Call your doctor if you experience significant pain, nausea, or vomiting, fever > 101 or other signs of infection. [phone] or [phone] Specific instructions:  Follow-up Information    Birder Robson, MD Follow up.   Specialty:  Ophthalmology Why:  12/20/17 at 10:40 Contact information: [address] [place] [address] [phone]          Eye Surgery Discharge Instructions    Expect mild scratchy sensation or mild soreness. DO NOT RUB YOUR EYE!  The day of surgery:  Minimal physical activity, but bed rest is not required  No reading, computer work, or close hand work  No bending, lifting, or straining.  May watch TV  For 24 hours:  No driving, legal decisions, or alcoholic beverages  Safety precautions  Eat anything you prefer: It is better to start with liquids, then soup then solid foods.  _____ Eye patch should be worn until postoperative exam tomorrow.  ____ Solar shield eyeglasses should be worn for comfort in the sunlight/patch while sleeping  Resume all regular medications including aspirin or Coumadin if  these were discontinued prior to surgery. You may shower, bathe, shave, or wash your hair. Tylenol may be taken for mild discomfort.  Call your doctor if you experience significant pain, nausea, or vomiting, fever > 101 or other signs of infection. [phone] or [phone] Specific instructions:  Follow-up Information    Birder Robson, MD Follow up.   Specialty:  Ophthalmology Why:  12/20/17 at 10:40 Contact information: [address] [place] [address] [phone]

## 2017-12-19 NOTE — Anesthesia Preprocedure Evaluation (Signed)
Anesthesia Evaluation  Patient identified by MRN, date of birth, ID band Patient awake    Reviewed: Allergy & Precautions, NPO status , Patient's Chart, lab work & pertinent test results  History of Anesthesia Complications Negative for: history of anesthetic complications  Airway Mallampati: III  TM Distance: >3 FB Neck ROM: Full    Dental no notable dental hx.    Pulmonary neg sleep apnea, neg COPD,    breath sounds clear to auscultation- rhonchi (-) wheezing      Cardiovascular Exercise Tolerance: Good (-) hypertension(-) CAD, (-) Past MI, (-) Cardiac Stents and (-) CABG + dysrhythmias + Valvular Problems/Murmurs  Rhythm:Regular Rate:Normal - Systolic murmurs and - Diastolic murmurs    Neuro/Psych negative neurological ROS  negative psych ROS   GI/Hepatic Neg liver ROS, GERD  ,  Endo/Other  negative endocrine ROSneg diabetes  Renal/GU negative Renal ROS     Musculoskeletal negative musculoskeletal ROS (+)   Abdominal (+) - obese,   Peds  Hematology negative hematology ROS (+)   Anesthesia Other Findings Past Medical History: No date: Cancer ([place])     Comment:  SKIN No date: Dyspnea     Comment:  DOE No date: Dysrhythmia No date: GERD (gastroesophageal reflux disease) No date: Heart murmur   Reproductive/Obstetrics                             Anesthesia Physical  Anesthesia Plan  ASA: III  Anesthesia Plan: MAC   Post-op Pain Management:    Induction: Intravenous  PONV Risk Score and Plan: 2 and Midazolam  Airway Management Planned: Natural Airway  Additional Equipment:   Intra-op Plan:   Post-operative Plan:   Informed Consent: I have reviewed the patients History and Physical, chart, labs and discussed the procedure including the risks, benefits and alternatives for the proposed anesthesia with the patient or authorized representative who has indicated his/her  understanding and acceptance.     Plan Discussed with: CRNA and Anesthesiologist  Anesthesia Plan Comments:         Anesthesia Quick Evaluation

## 2017-12-19 NOTE — H&P (Signed)
All labs reviewed. Abnormal studies sent to patients PCP when indicated.  Previous H&P reviewed, patient examined, there are NO CHANGES.  Meghan Clugston Porfilio11/19/201911:17 AM

## 2017-12-19 NOTE — Transfer of Care (Signed)
Immediate Anesthesia Transfer of Care Note  Patient: Meghan Bell  Procedure(s) Performed: CATARACT EXTRACTION PHACO AND INTRAOCULAR LENS PLACEMENT (IOC) (Left Eye)  Patient Location: Short Stay  Anesthesia Type:MAC  Level of Consciousness: awake, alert , oriented and patient cooperative  Airway & Oxygen Therapy: Patient Spontanous Breathing  Post-op Assessment: Report given to RN and Post -op Vital signs reviewed and stable  Post vital signs: Reviewed and stable  Last Vitals:  Vitals Value Taken Time  BP    Temp    Pulse    Resp    SpO2      Last Pain:  Vitals:   12/19/17 1035  TempSrc: Tympanic  PainSc: 0-No pain         Complications: No apparent anesthesia complications

## 2017-12-20 ENCOUNTER — Encounter: Payer: Self-pay | Admitting: Ophthalmology

## 2018-10-25 ENCOUNTER — Other Ambulatory Visit (HOSPITAL_COMMUNITY): Payer: Self-pay | Admitting: Acute Care

## 2018-10-25 ENCOUNTER — Other Ambulatory Visit: Payer: Self-pay | Admitting: Acute Care

## 2018-10-25 DIAGNOSIS — I671 Cerebral aneurysm, nonruptured: Secondary | ICD-10-CM

## 2018-12-19 ENCOUNTER — Other Ambulatory Visit: Payer: Self-pay

## 2018-12-19 DIAGNOSIS — Z20822 Contact with and (suspected) exposure to covid-19: Secondary | ICD-10-CM

## 2018-12-21 LAB — NOVEL CORONAVIRUS, NAA: SARS-CoV-2, NAA: NOT DETECTED

## 2019-05-22 ENCOUNTER — Other Ambulatory Visit: Payer: Self-pay | Admitting: Podiatry

## 2019-05-29 ENCOUNTER — Encounter: Payer: Self-pay | Admitting: Podiatry

## 2019-05-31 NOTE — Discharge Instructions (Signed)
[facility]  POST OPERATIVE INSTRUCTIONS FOR DR. TROXLER, DR. FOWLER, AND DR. [facility]   1. Take your medication as prescribed.  Pain medication should be taken only as needed.  2. Keep the dressing clean, dry and intact.  3. Keep your foot elevated above the heart level for the first 48 hours.  4. Walking to the bathroom and brief periods of walking are acceptable, unless we have instructed you to be non-weight bearing.  5. Always wear your post-op shoe when walking.  Always use your crutches if you are to be non-weight bearing.  6. Do not take a shower. Baths are permissible as long as the foot is kept out of the water.   7. Every hour you are awake:  - Bend your knee 15 times. - Flex foot 15 times - Massage calf 15 times  8. Call [facility] ([phone]) if any of the following problems occur: - You develop a temperature or fever. - The bandage becomes saturated with blood. - Medication does not stop your pain. - Injury of the foot occurs. - Any symptoms of infection including redness, odor, or red streaks running from wound.   General Anesthesia, Adult, Care After This sheet gives you information about how to care for yourself after your procedure. Your health care provider may also give you more specific instructions. If you have problems or questions, contact your health care provider. What can I expect after the procedure? After the procedure, the following side effects are common:  Pain or discomfort at the IV site.  Nausea.  Vomiting.  Sore throat.  Trouble concentrating.  Feeling cold or chills.  Weak or tired.  Sleepiness and fatigue.  Soreness and body aches. These side effects can affect parts of the body that were not involved in surgery. Follow these instructions at home:  For at least 24 hours after the procedure:  Have a responsible adult stay with you. It is  important to have someone help care for you until you are awake and alert.  Rest as needed.  Do not: ? Participate in activities in which you could fall or become injured. ? Drive. ? Use heavy machinery. ? Drink alcohol. ? Take sleeping pills or medicines that cause drowsiness. ? Make important decisions or sign legal documents. ? Take care of children on your own. Eating and drinking  Follow any instructions from your health care provider about eating or drinking restrictions.  When you feel hungry, start by eating small amounts of foods that are soft and easy to digest (bland), such as toast. Gradually return to your regular diet.  Drink enough fluid to keep your urine pale yellow.  If you vomit, rehydrate by drinking water, juice, or clear broth. General instructions  If you have sleep apnea, surgery and certain medicines can increase your risk for breathing problems. Follow instructions from your health care provider about wearing your sleep device: ? Anytime you are sleeping, including during daytime naps. ? While taking prescription pain medicines, sleeping medicines, or medicines that make you drowsy.  Return to your normal activities as told by your health care provider. Ask your health care provider what activities are safe for you.  Take over-the-counter and prescription medicines only as told by your health care provider.  If you smoke, do not smoke without supervision.  Keep all follow-up visits as told by your health care provider. This is important. Contact a health care provider if:    You have nausea or vomiting that does not get better with medicine.  You cannot eat or drink without vomiting.  You have pain that does not get better with medicine.  You are unable to pass urine.  You develop a skin rash.  You have a fever.  You have redness around your IV site that gets worse. Get help right away if:  You have difficulty breathing.  You have chest  pain.  You have blood in your urine or stool, or you vomit blood. Summary  After the procedure, it is common to have a sore throat or nausea. It is also common to feel tired.  Have a responsible adult stay with you for the first 24 hours after general anesthesia. It is important to have someone help care for you until you are awake and alert.  When you feel hungry, start by eating small amounts of foods that are soft and easy to digest (bland), such as toast. Gradually return to your regular diet.  Drink enough fluid to keep your urine pale yellow.  Return to your normal activities as told by your health care provider. Ask your health care provider what activities are safe for you. This information is not intended to replace advice given to you by your health care provider. Make sure you discuss any questions you have with your health care provider. Document Revised: 01/20/2017 Document Reviewed: 09/02/2016 [employer] Patient Education  2020 [employer]  

## 2019-06-03 ENCOUNTER — Encounter: Payer: Self-pay | Admitting: Anesthesiology

## 2019-06-03 ENCOUNTER — Other Ambulatory Visit: Payer: Medicare PPO

## 2019-06-05 ENCOUNTER — Ambulatory Visit: Admit: 2019-06-05 | Payer: Medicare PPO | Admitting: Podiatry

## 2019-06-05 HISTORY — DX: Other amnesia: R41.3

## 2019-06-05 HISTORY — DX: Sleep apnea, unspecified: G47.30

## 2019-06-05 HISTORY — DX: Deficiency of other specified B group vitamins: E53.8

## 2019-06-05 HISTORY — DX: Other hammer toe(s) (acquired), right foot: M20.41

## 2019-06-05 HISTORY — DX: Unspecified osteoarthritis, unspecified site: M19.90

## 2019-06-05 HISTORY — DX: Atherosclerotic heart disease of native coronary artery without angina pectoris: I25.10

## 2019-06-05 HISTORY — DX: Depression, unspecified: F32.A

## 2019-06-05 HISTORY — DX: Cerebral aneurysm, nonruptured: I67.1

## 2019-06-05 HISTORY — DX: Essential (primary) hypertension: I10

## 2019-06-05 HISTORY — DX: Age-related osteoporosis without current pathological fracture: M81.0

## 2019-06-05 SURGERY — AMPUTATION, TOE
Anesthesia: Choice | Site: Toe | Laterality: Right

## 2020-12-01 ENCOUNTER — Emergency Department: Payer: Medicare PPO

## 2020-12-01 ENCOUNTER — Encounter: Payer: Self-pay | Admitting: Radiology

## 2020-12-01 ENCOUNTER — Inpatient Hospital Stay: Admit: 2020-12-01 | Payer: Medicare PPO

## 2020-12-01 ENCOUNTER — Inpatient Hospital Stay: Payer: Medicare PPO

## 2020-12-01 ENCOUNTER — Observation Stay
Admission: EM | Admit: 2020-12-01 | Discharge: 2020-12-02 | Disposition: A | Discharge status: Home / Self Care | Payer: Medicare PPO | Attending: Internal Medicine | Admitting: Internal Medicine

## 2020-12-01 DIAGNOSIS — Z20822 Contact with and (suspected) exposure to covid-19: Secondary | ICD-10-CM | POA: Insufficient documentation

## 2020-12-01 DIAGNOSIS — Z7982 Long term (current) use of aspirin: Secondary | ICD-10-CM | POA: Insufficient documentation

## 2020-12-01 DIAGNOSIS — R2681 Unsteadiness on feet: Secondary | ICD-10-CM | POA: Diagnosis not present

## 2020-12-01 DIAGNOSIS — I639 Cerebral infarction, unspecified: Secondary | ICD-10-CM | POA: Diagnosis not present

## 2020-12-01 DIAGNOSIS — Z9989 Dependence on other enabling machines and devices: Secondary | ICD-10-CM

## 2020-12-01 DIAGNOSIS — K219 Gastro-esophageal reflux disease without esophagitis: Secondary | ICD-10-CM | POA: Insufficient documentation

## 2020-12-01 DIAGNOSIS — I10 Essential (primary) hypertension: Secondary | ICD-10-CM | POA: Diagnosis not present

## 2020-12-01 DIAGNOSIS — F0390 Unspecified dementia without behavioral disturbance: Secondary | ICD-10-CM | POA: Insufficient documentation

## 2020-12-01 DIAGNOSIS — G459 Transient cerebral ischemic attack, unspecified: Principal | ICD-10-CM | POA: Diagnosis present

## 2020-12-01 DIAGNOSIS — R278 Other lack of coordination: Secondary | ICD-10-CM | POA: Diagnosis not present

## 2020-12-01 DIAGNOSIS — R41841 Cognitive communication deficit: Secondary | ICD-10-CM | POA: Insufficient documentation

## 2020-12-01 DIAGNOSIS — R531 Weakness: Secondary | ICD-10-CM

## 2020-12-01 DIAGNOSIS — E118 Type 2 diabetes mellitus with unspecified complications: Secondary | ICD-10-CM | POA: Diagnosis present

## 2020-12-01 DIAGNOSIS — R079 Chest pain, unspecified: Secondary | ICD-10-CM

## 2020-12-01 DIAGNOSIS — F411 Generalized anxiety disorder: Secondary | ICD-10-CM | POA: Diagnosis present

## 2020-12-01 DIAGNOSIS — G4733 Obstructive sleep apnea (adult) (pediatric): Secondary | ICD-10-CM

## 2020-12-01 DIAGNOSIS — I25119 Atherosclerotic heart disease of native coronary artery with unspecified angina pectoris: Secondary | ICD-10-CM | POA: Diagnosis present

## 2020-12-01 DIAGNOSIS — Z8679 Personal history of other diseases of the circulatory system: Secondary | ICD-10-CM

## 2020-12-01 DIAGNOSIS — Z79899 Other long term (current) drug therapy: Secondary | ICD-10-CM | POA: Insufficient documentation

## 2020-12-01 DIAGNOSIS — Z9889 Other specified postprocedural states: Secondary | ICD-10-CM

## 2020-12-01 DIAGNOSIS — E119 Type 2 diabetes mellitus without complications: Secondary | ICD-10-CM | POA: Diagnosis not present

## 2020-12-01 LAB — TROPONIN I (HIGH SENSITIVITY)
Troponin I (High Sensitivity): 3 ng/L (ref <18)
Troponin I (High Sensitivity): 4 ng/L (ref <18)

## 2020-12-01 LAB — COMPREHENSIVE METABOLIC PANEL
ALT: 24 U/L (ref 0–44)
AST: 26 U/L (ref 15–41)
Albumin: 3.8 g/dL (ref 3.5–5.0)
Alkaline Phosphatase: 64 U/L (ref 38–126)
Anion gap: 10 (ref 5–15)
BUN: 9 mg/dL (ref 8–23)
CO2: 21 mmol/L — ABNORMAL LOW (ref 22–32)
Calcium: 8.6 mg/dL — ABNORMAL LOW (ref 8.9–10.3)
Chloride: 105 mmol/L (ref 98–111)
Creatinine, Ser: 0.65 mg/dL (ref 0.44–1.00)
GFR, Estimated: >60 mL/min (ref >60)
Glucose, Bld: 96 mg/dL (ref 70–99)
Potassium: 3.8 mmol/L (ref 3.5–5.1)
Sodium: 136 mmol/L (ref 135–145)
Total Bilirubin: 0.8 mg/dL (ref 0.3–1.2)
Total Protein: 6.7 g/dL (ref 6.5–8.1)

## 2020-12-01 LAB — RESP PANEL BY RT-PCR (FLU A&B, COVID) ARPGX2
Influenza A by PCR: NEGATIVE
Influenza B by PCR: NEGATIVE
SARS Coronavirus 2 by RT PCR: NEGATIVE

## 2020-12-01 LAB — CBC
HCT: 35.9 % — ABNORMAL LOW (ref 36.0–46.0)
Hemoglobin: 12.2 g/dL (ref 12.0–15.0)
MCH: 33.8 pg (ref 26.0–34.0)
MCHC: 34 g/dL (ref 30.0–36.0)
MCV: 99.4 fL (ref 80.0–100.0)
Platelets: 312 10*3/uL (ref 150–400)
RBC: 3.61 MIL/uL — ABNORMAL LOW (ref 3.87–5.11)
RDW: 14.5 % (ref 11.5–15.5)
WBC: 8.1 10*3/uL (ref 4.0–10.5)
nRBC: 0 % (ref 0.0–0.2)

## 2020-12-01 LAB — PROTIME-INR
INR: 1 (ref 0.8–1.2)
Prothrombin Time: 13.3 seconds (ref 11.4–15.2)

## 2020-12-01 LAB — DIFFERENTIAL
Abs Immature Granulocytes: 0.07 10*3/uL (ref 0.00–0.07)
Basophils Absolute: 0.1 10*3/uL (ref 0.0–0.1)
Basophils Relative: 2 %
Eosinophils Absolute: 0.2 10*3/uL (ref 0.0–0.5)
Eosinophils Relative: 2 %
Immature Granulocytes: 1 %
Lymphocytes Relative: 28 %
Lymphs Abs: 2.3 10*3/uL (ref 0.7–4.0)
Monocytes Absolute: 0.8 10*3/uL (ref 0.1–1.0)
Monocytes Relative: 10 %
Neutro Abs: 4.7 10*3/uL (ref 1.7–7.7)
Neutrophils Relative %: 57 %

## 2020-12-01 LAB — APTT: aPTT: 27 s (ref 24–36)

## 2020-12-01 LAB — CBG MONITORING, ED: Glucose-Capillary: 123 mg/dL — ABNORMAL HIGH (ref 70–99)

## 2020-12-01 MED ORDER — DONEPEZIL HCL 5 MG PO TABS
5.0000 mg | ORAL_TABLET | Freq: Every day | ORAL | Status: DC
  Administered 2020-12-02: 5 mg via ORAL
  Filled 2020-12-01: qty 1

## 2020-12-01 MED ORDER — SODIUM CHLORIDE 0.9% FLUSH
3.0000 mL | Freq: Once | INTRAVENOUS | Status: AC
Start: 2020-12-01 — End: 2020-12-01
  Administered 2020-12-01: 3 mL via INTRAVENOUS

## 2020-12-01 MED ORDER — STROKE: EARLY STAGES OF RECOVERY BOOK: Freq: Once | Status: DC

## 2020-12-01 MED ORDER — IOHEXOL 350 MG/ML SOLN
125.0000 mL | Freq: Once | INTRAVENOUS | Status: AC | PRN
  Administered 2020-12-01: 125 mL via INTRAVENOUS

## 2020-12-01 MED ORDER — PANTOPRAZOLE SODIUM 40 MG PO TBEC: 40.0000 mg | DELAYED_RELEASE_TABLET | Freq: Every day | ORAL | Status: DC

## 2020-12-01 MED ORDER — CLOPIDOGREL BISULFATE 75 MG PO TABS
75.0000 mg | ORAL_TABLET | Freq: Every day | ORAL | Status: DC
  Administered 2020-12-02: 75 mg via ORAL
  Filled 2020-12-01 (×2): qty 1

## 2020-12-01 MED ORDER — ACETAMINOPHEN 160 MG/5ML PO SOLN
650.0000 mg | ORAL | Status: DC | PRN
  Filled 2020-12-01: qty 20.3

## 2020-12-01 MED ORDER — PRAVASTATIN SODIUM 20 MG PO TABS: 20.0000 mg | ORAL_TABLET | Freq: Every day | ORAL | Status: DC

## 2020-12-01 MED ORDER — ENOXAPARIN SODIUM 40 MG/0.4ML IJ SOSY
40.0000 mg | PREFILLED_SYRINGE | Freq: Every day | INTRAMUSCULAR | Status: DC
  Administered 2020-12-02: 40 mg via SUBCUTANEOUS
  Filled 2020-12-01: qty 0.4

## 2020-12-01 MED ORDER — NITROGLYCERIN 0.4 MG SL SUBL: 0.4000 mg | SUBLINGUAL_TABLET | SUBLINGUAL | Status: DC | PRN

## 2020-12-01 MED ORDER — SODIUM CHLORIDE 0.9 % IV SOLN: INTRAVENOUS | Status: DC

## 2020-12-01 MED ORDER — ASPIRIN 81 MG PO CHEW
324.0000 mg | CHEWABLE_TABLET | Freq: Once | ORAL | Status: AC
  Administered 2020-12-01: 324 mg via ORAL
  Filled 2020-12-01: qty 4

## 2020-12-01 MED ORDER — CITALOPRAM HYDROBROMIDE 20 MG PO TABS
20.0000 mg | ORAL_TABLET | Freq: Every day | ORAL | Status: DC
  Administered 2020-12-02: 20 mg via ORAL
  Filled 2020-12-01: qty 1

## 2020-12-01 MED ORDER — ACETAMINOPHEN 325 MG PO TABS
650.0000 mg | ORAL_TABLET | ORAL | Status: DC | PRN
  Filled 2020-12-01: qty 2

## 2020-12-01 MED ORDER — METOPROLOL TARTRATE 25 MG PO TABS
25.0000 mg | ORAL_TABLET | Freq: Two times a day (BID) | ORAL | Status: DC
  Filled 2020-12-01 (×2): qty 1

## 2020-12-01 MED ORDER — ACETAMINOPHEN 325 MG RE SUPP: 650.0000 mg | RECTAL | Status: DC | PRN

## 2020-12-01 NOTE — ED Notes (Addendum)
[facility] 123 Pt to CT 2

## 2020-12-01 NOTE — ED Provider Notes (Signed)
[facility] Emergency Department Provider Note  ____________________________________________   Event Date/Time   First MD Initiated Contact with Patient 12/01/20 1830     (approximate)  I have reviewed the triage vital signs and the nursing notes.   HISTORY  Chief Complaint Chest Pain   HPI ZYRA PARRILLO is a 80 y.o. female with past medical history of arthritis, B12 deficiency, dementia, brain aneurysm, GERD, HTN, osteoporosis and OSA who presents accompanied by daughter after initially presenting from clinical clinic referred to the ED for evaluation of some left-sided numbness and left arm weakness as well as  chest pain that seems to have started around 5 PM.  Per daughter at bedside patient called her around 5 PM stating she was not feeling good and was feeling weak.  Patient made code stroke on arrival.  Per daughter she was reportedly completely normal and baseline around 4 PM.  She does have some difficulty remembering things is not oriented typically secondary to dementia baseline.  Per daughter and patient no recent injuries or falls, other episodes of chest pain, cough, fevers, vomiting, diarrhea, burning with urination, abdominal pain, shortness of breath, back pain rash or extremity pain.  No recent prior similar episodes.  No other acute concerns at this time.  Patient states her pain in her chest is actually just mild tightness at this time.  He states she is feels little weak but not that she is going to pass out.         Past Medical History:  Diagnosis Date  . Arthritis   . B12 deficiency   . Brain aneurysm   . 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 Coronary artery disease   . Depression   . Dyspnea    DOE  . Dysrhythmia   . GERD (gastroesophageal reflux disease)   . Hammer toe of right foot    second toe  . Heart murmur   . Hypertension   . Memory loss   . Osteoporosis   . Sleep apnea     Patient Active Problem List    Diagnosis Date Noted  . CVA (cerebral vascular accident) ([place]) 12/01/2020  . Chest pain 12/01/2020  . Controlled type 2 diabetes mellitus with complication, without long-term current use of insulin ([place]) 12/05/2016  . H/O cerebral aneurysm repair 11/28/2016  . Anxiety state 09/21/2016  . OSA on CPAP 11/23/2015  . Coronary artery disease involving native coronary artery of native heart with angina pectoris ([place]) 05/23/2014  . Essential hypertension 07/07/2013     Past Surgical History:  Procedure Laterality Date  . ABDOMINAL HYSTERECTOMY  1972  . APPENDECTOMY    . BLADDER SURGERY  2011  . BRAIN SURGERY     ANEURTSM WITH COIL  . BREAST SURGERY Left 2002   LUMPECTOMY  . CATARACT EXTRACTION W/PHACO Right 11/21/2017   Procedure: CATARACT EXTRACTION PHACO AND INTRAOCULAR LENS PLACEMENT (IOC);  Surgeon: Birder Robson, MD;  Location: [facility] ORS;  Service: Ophthalmology;  Laterality: Right;  [place] 00:41 CDE 5.29 Fluid pack lot # [id number]  . CATARACT EXTRACTION W/PHACO Left 12/19/2017   Procedure: CATARACT EXTRACTION PHACO AND INTRAOCULAR LENS PLACEMENT (IOC);  Surgeon: Birder Robson, MD;  Location: [facility] ORS;  Service: Ophthalmology;  Laterality: Left;  [place] 00:33 CDE 3.94 Fluid pack lot # [id number]  . CHOLECYSTECTOMY    . FOOT SURGERY    . HEMORROIDECTOMY    . RCR  2006  . SALPINGOOPHORECTOMY    . TONSILLECTOMY  Prior to Admission medications   Medication Sig Start Date End Date Taking? Authorizing Provider  acetaminophen (TYLENOL) 325 MG tablet Take 650 mg by mouth every 6 (six) hours as needed.    [provider]  aspirin 81 MG chewable tablet Chew by mouth daily.    [provider]  citalopram (CELEXA) 20 MG tablet Take 20 mg by mouth daily.    [provider]  diphenhydrAMINE (BENADRYL) 25 mg capsule Take 25 mg by mouth every 6 (six) hours as needed.    [provider]  donepezil (ARICEPT) 5 MG tablet Take 5 mg by mouth at bedtime.     [provider]  fluticasone (FLONASE) 50 MCG/ACT nasal spray Place into both nostrils daily.    [provider]  lovastatin (MEVACOR) 20 MG tablet Take 20 mg by mouth at bedtime.    [provider]  metoprolol tartrate (LOPRESSOR) 25 MG tablet Take 25 mg by mouth 2 (two) times daily.    [provider]  nitroGLYCERIN (NITROSTAT) 0.4 MG SL tablet Place 0.4 mg under the tongue every 5 (five) minutes as needed for chest pain.    [provider]  pantoprazole (PROTONIX) 40 MG tablet Take 40 mg by mouth daily.    [provider]    Allergies Erythromycin, Amoxicillin, Bacitracin, Elemental sulfur, Nitrofurantoin, Penicillins, and Sulfa antibiotics  Family History  Problem Relation Age of Onset  . Glaucoma Mother   . Heart disease Mother   . Heart disease Father   . Migraines Father   . Heart disease Sister   . Hypertension Sister   . Diabetes Sister   . Diabetes Brother     Social History Social History   Tobacco Use  . Smoking status: Never  . Smokeless tobacco: Never  Vaping Use  . Vaping Use: Never used  Substance Use Topics  . Alcohol use: Not Currently  . Drug use: Never    Review of Systems  Review of Systems  Constitutional:  Negative for chills and fever.  HENT:  Negative for sore throat.   Eyes:  Negative for pain.  Respiratory:  Negative for cough and stridor.   Cardiovascular:  Positive for chest pain.  Gastrointestinal:  Negative for vomiting.  Genitourinary:  Negative for dysuria.  Musculoskeletal:  Negative for myalgias.  Skin:  Negative for rash.  Neurological:  Positive for dizziness, sensory change (L facial numbness) and weakness. Negative for seizures, loss of consciousness and headaches.  Psychiatric/Behavioral:  Negative for suicidal ideas.   All other systems reviewed and are negative.    ____________________________________________   PHYSICAL EXAM:  VITAL SIGNS: ED Triage Vitals  Enc  Vitals Group     BP 12/01/20 1825 (!) 119/98     Pulse Rate 12/01/20 1825 69     Resp 12/01/20 1825 18     Temp 12/01/20 1828 98.6 F (37 C)     Temp Source 12/01/20 1825 Oral     SpO2 12/01/20 1825 92 %     Weight --      Height --      Head Circumference --      Peak Flow --      Pain Score --      Pain Loc --      Pain Edu? --      Excl. in [place]? --    Vitals:   12/01/20 2030 12/01/20 2100  BP: (!) 153/81 127/86  Pulse: 60 70  Resp: 15 (!) 21  Temp:    SpO2: 97% 98%   Physical Exam Vitals and nursing note reviewed.  Constitutional:      General: She is not in acute distress.    Appearance: She is well-developed.  HENT:     Head: Normocephalic and atraumatic.     Right Ear: External ear normal.     Left Ear: External ear normal.     Nose: Nose normal.  Eyes:     Conjunctiva/sclera: Conjunctivae normal.  Cardiovascular:     Rate and Rhythm: Normal rate and regular rhythm.     Heart sounds: No murmur heard. Pulmonary:     Effort: Pulmonary effort is normal. No respiratory distress.     Breath sounds: Normal breath sounds.  Abdominal:     Palpations: Abdomen is soft.     Tenderness: There is no abdominal tenderness.  Musculoskeletal:     Cervical back: Neck supple.  Skin:    General: Skin is warm and dry.     Capillary Refill: Capillary refill takes less than 2 seconds.  Neurological:     Mental Status: She is alert and oriented to person, place, and time.  Psychiatric:        Mood and Affect: Mood normal.    Cranial nerves II through XII grossly intact.  No pronator drift.  No finger dysmetria.  Symmetric 5/5 strength of all extremities.  Sensation intact to light touch in all extremities.   ____________________________________________   LABS (all labs ordered are listed, but only abnormal results are displayed)  Labs Reviewed  CBC - Abnormal; Notable for the following components:      Result Value   RBC 3.61 (*)    HCT 35.9 (*)    All other components  within normal limits  COMPREHENSIVE METABOLIC PANEL - Abnormal; Notable for the following components:   CO2 21 (*)    Calcium 8.6 (*)    All other components within normal limits  CBG MONITORING, ED - Abnormal; Notable for the following components:   Glucose-Capillary 123 (*)    All other components within normal limits  RESP PANEL BY RT-PCR (FLU A&B, COVID) ARPGX2  DIFFERENTIAL  PROTIME-INR  APTT  TROPONIN I (HIGH SENSITIVITY)  TROPONIN I (HIGH SENSITIVITY)   ____________________________________________  EKG  ECG remarkable for sinus rhythm with a ventricular rate of 78, normal axis, unremarkable intervals without clearance of acute ischemia or significant arrhythmia. ____________________________________________  RADIOLOGY  ED MD interpretation: CT head without contrast without evidence of significant ischemia, hemorrhage or other clear acute process.  CTA head and neck remarkable for some narrowing and occlusion of the right P2 segment with distal reconstitution in V3 segment and some narrowing at the distal 3 and V4 segments.  There is also some stenosis noted at the vertebral artery without other significant acute occlusion.  CTA chest shows no evidence of dissection, pneumonia, pneumothorax, effusion, edema or other acute thoracic process.  No large PE.  There is some minor atherosclerotic plaque noted at the left subclavian artery.  Official radiology report(s): CT Angio Chest Aorta W and/or Wo Contrast  Result Date: 12/01/2020 CLINICAL DATA:  Left-sided numbness and chest pain beginning at 4 p.m. today. Left sided facial droop. EXAM: CT ANGIOGRAPHY CHEST WITH CONTRAST TECHNIQUE: Multidetector CT imaging of the chest was performed using the standard protocol during bolus administration of intravenous contrast. Multiplanar CT image reconstructions and MIPs were obtained to evaluate the vascular anatomy. CONTRAST:  166mL OMNIPAQUE IOHEXOL 350 MG/ML SOLN COMPARISON:  12/17/2008.  Chest radiograph, 10/14/2016. FINDINGS: Cardiovascular: Heart is normal in size and configuration. No pericardial effusion. Mild three-vessel coronary artery calcifications. Great vessels are normal in caliber. No aortic dissection or atherosclerosis. Minor atherosclerotic plaque at the origin of the left subclavian artery. Arch branch vessels are widely patent. Mediastinum/Nodes: Normal sized thyroid with small hypoattenuating nodules, largest 1 cm. Findings are similar to the prior CT. Not clinically significant; no follow-up imaging recommended (ref: J Am Coll Radiol. 2015 Feb;12(2): 143-50). No mediastinal or hilar masses. No enlarged lymph nodes. Trachea and esophagus are unremarkable. Lungs/Pleura: Scarring and bronchiectasis in the medial right lower lobe. Mild linear reticular scarring the left lung base. No evidence of pneumonia or pulmonary edema. No lung mass or nodule. No pleural effusion or pneumothorax. Upper Abdomen: 1.4 cm low-attenuation lesion with peripheral calcification the liver dome replaces the large cystic mass noted on the prior CT. 2 cm cyst in the left lobe, mildly increased in size from prior CT. There are no acute findings in the visualized upper abdomen. Musculoskeletal: No fracture or acute finding.  No bone lesion. Review of the MIP images confirms the above findings. IMPRESSION: 1. Normal appearance of the thoracic aorta.  No dissection. 2. Minor atherosclerotic plaque at the origin of the left subclavian artery. No hemodynamically significant stenosis the arch branch vessels. 3. No acute abnormalities within the chest. Electronically Signed   By: Lajean Manes M.D.   On: 12/01/2020 19:14   CT HEAD CODE STROKE WO CONTRAST  Result Date: 12/01/2020 CLINICAL DATA:  Code stroke. Left-sided numbness and chest pain, left-sided facial droop EXAM: CT HEAD WITHOUT CONTRAST TECHNIQUE: Contiguous axial images were obtained from the base of the skull through the vertex without intravenous  contrast. COMPARISON:  None. FINDINGS: Brain: No evidence of acute infarction, hemorrhage, cerebral edema, mass, mass effect, or midline shift. Ventricles and sulci are normal for age. No extra-axial fluid collection. Vascular: No hyperdense vessel or unexpected calcification. Skull: Normal. Negative for fracture or focal lesion. Hyperostosis frontalis Sinuses/Orbits: No acute finding. Status post bilateral lens replacements. Other: The mastoid air cells are well aerated. ASPECTS [facility] Stroke Program Early CT Score) - Ganglionic level infarction (caudate, lentiform nuclei, internal capsule, insula, M1-M3 cortex): 7 - Supraganglionic infarction (M4-M6 cortex): 3 Total score (0-10 with 10 being normal): 10 IMPRESSION: 1. No acute intracranial process. 2. ASPECTS is 10 Code stroke imaging results were communicated on 12/01/2020 at 6:44 pm to provider Dr. Quentin Cornwall via telephone, who verbally acknowledged these results. Electronically Signed   By: Merilyn Baba M.D.   On: 12/01/2020 18:44   CT ANGIO HEAD NECK W WO CM (CODE STROKE)  Result Date: 12/01/2020 CLINICAL DATA:  Facial numbness EXAM: CT ANGIOGRAPHY HEAD AND NECK TECHNIQUE: Multidetector CT imaging of the head and neck was performed using the standard protocol during bolus administration of intravenous contrast. Multiplanar CT image reconstructions and MIPs were obtained to evaluate the vascular anatomy. Carotid stenosis measurements (when applicable) are obtained utilizing NASCET criteria, using the distal internal carotid diameter as the denominator. CONTRAST:  167mL OMNIPAQUE IOHEXOL 350 MG/ML SOLN COMPARISON:  Same day CT head. FINDINGS: CT HEAD FINDINGS Please see same day CT head without contrast. CTA NECK FINDINGS Aortic arch: Standard branching. Imaged portion shows no evidence of aneurysm or dissection. No significant stenosis of the major arch vessel origins. Right carotid system: No evidence of dissection, stenosis (50% or greater) or occlusion.  Left carotid system: No evidence of dissection, stenosis (50% or greater) or occlusion. Vertebral arteries: Evaluation  of the origin the right vertebral artery is limited by beam hardening artifact from adjacent venous bolus. Within this limitation, there is likely stenosis at the origin of the right vertebral artery, which is diminutive and becomes non-opacified in the mid V2 segment (series 7, images 139-150), with distal reconstitution in the V3 segment (series 7, image 199). The left vertebral artery is patent from the origin to the V4 segment. Skeleton: Multilevel degenerative changes in the cervical spine, with osseous fusion of C5 and C6. Reversal of the normal cervical lordosis. Other neck: Multiple hypoattenuating nodules in the thyroid, right greater than left lobe, the largest of which measures up to 1.0 cm. Upper chest: No focal pulmonary opacity or pleural effusion. 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 Suspect right cavernous ICA stent, which appears patent. Both internal carotid arteries are patent to the termini, without stenosis or other abnormality. A1 segments patent. Normal anterior communicating artery. Anterior cerebral arteries are patent to their distal aspects. No M1 stenosis or occlusion. Normal MCA bifurcations. Distal MCA branches perfused and symmetric. Posterior circulation: Narrowing of the distal right V3 and V4 segments (series 7, image 200, 214, 222). Left vertebral artery is dpatent to the vertebrobasilar junction without stenosis. Posterior inferior cerebral arteries patent bilaterally. Basilar patent to its distal aspect. Superior cerebral arteries patent bilaterally. PCAs well perfused to their distal aspects without stenosis. Venous sinuses: As permitted by contrast timing, patent. Anatomic variants: Bilateral posterior communicating arteries are not visualized. Review of the MIP  images confirms the above findings IMPRESSION: 1. 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 In addition, there is multifocal narrowing of the distal right V3 and V4 segments, which remain patent. There is likely stenosis at the origin of the right vertebral artery; however, evaluation is limited by beam hardening artifact from the adjacent venous bolus. 2. No intracranial vessel occlusion. Electronically Signed   By: Merilyn Baba M.D.   On: 12/01/2020 19:22    ____________________________________________   PROCEDURES  Procedure(s) performed (including Critical Care):  .1-3 Lead EKG Interpretation Performed by: Lucrezia Starch, MD Authorized by: Lucrezia Starch, MD     Interpretation: non-specific     ECG rate assessment: normal     Rhythm: sinus rhythm     Ectopy: none     Conduction: normal     ____________________________________________   INITIAL IMPRESSION / ASSESSMENT AND PLAN / ED COURSE        Patient presents with above to history exam for assessment of some left-sided weakness and numbness as well as chest pain that began early this afternoon as described above.  On arrival patient is afebrile and hemodynamically stable.  With regard to her left-sided arm weakness and facial numbness she reports this is gotten better and still she is just not well and overall weak.  On exam she has no objective deficits.  Differential occludes TIA, atypical migraine, possible seizure, metabolic derangements or dissection given association with chest pain.  Additional differential for patient's chest pain includes ACS, arrhythmia, anemia and metabolic derangements.  She denies any shortness of breath and has no tachypnea, hypoxia, tachycardia or other risk factors to suggest a PE at this time.   CT head without contrast without evidence of significant ischemia, hemorrhage or other clear acute process.  CTA head and neck  remarkable for some narrowing and occlusion of the  right P2 segment with distal reconstitution in V3 segment and some narrowing at the distal 3 and V4 segments.  There is also some stenosis noted at the vertebral artery without other significant acute occlusion.  CTA chest shows no evidence of dissection, pneumonia, pneumothorax, effusion, edema or other acute thoracic process.  No large PE.  There is some minor atherosclerotic plaque noted at the left subclavian artery.  ECG and nonelevated troponin not suggestive of ACS or arrhythmia.  CMP without significant electrolyte metabolic derangements.  CBC without leukocytosis or acute anemia.  COVID influenza test is negative.  At this time unclear etiology of patient's chest discomfort.  Discussed patient's presentation with on-call neurologist Dr. Kerney Elbe recommended hospitalist admission for further evaluation and management.  I will admit to medicine service for further evaluation.     ____________________________________________   FINAL CLINICAL IMPRESSION(S) / ED DIAGNOSES  Final diagnoses:  Weakness  Chest pain, unspecified type    Medications  sodium chloride flush (NS) 0.9 % injection 3 mL (3 mLs Intravenous Given 12/01/20 1935)  iohexol (OMNIPAQUE) 350 MG/ML injection 125 mL (125 mLs Intravenous Contrast Given 12/01/20 1901)  aspirin chewable tablet 324 mg (324 mg Oral Given 12/01/20 2053)     ED Discharge Orders     None        Note:  This document was prepared using Dragon voice recognition software and may include unintentional dictation errors.    Lucrezia Starch, MD 12/01/20 2121

## 2020-12-01 NOTE — ED Notes (Signed)
Called CODE STROKE to [facility] spoke with Maudie Mercury

## 2020-12-01 NOTE — Consult Note (Signed)
NEURO HOSPITALIST CONSULT NOTE   Requestig physician: Dr. Tamala Julian  Reason for Consult:Left sided numbness and left facial droop in the context of chest pain starting at 1600  History obtained from:  Patient, Family and Chart     HPI:                                                                                                                                          Meghan Bell is an 80 y.o. female with a PMHx of B12 deficiency, brain aneurysm s/p coiling, CAD, depression, dyspnea on exertion, dysrhythmia, HTN, memory loss, osteoporosis and sleep apnea presenting from [facility] with left sided numbness, left facial droop and chest pain that started at 1600. Code Stroke was called in the ED.   Home medications include ASA and Aricept.   Past Medical History:  Diagnosis Date   Arthritis    B12 deficiency    Brain aneurysm    Cancer ([place])    SKIN-non-melanoma   Coronary artery disease    Depression    Dyspnea    DOE   Dysrhythmia    GERD (gastroesophageal reflux disease)    Hammer toe of right foot    second toe   Heart murmur    Hypertension    Memory loss    Osteoporosis    Sleep apnea     Past Surgical History:  Procedure Laterality Date   ABDOMINAL HYSTERECTOMY  1972   APPENDECTOMY     BLADDER SURGERY  2011   BRAIN SURGERY     ANEURTSM WITH COIL   BREAST SURGERY Left 2002   LUMPECTOMY   CATARACT EXTRACTION W/PHACO Right 11/21/2017   Procedure: CATARACT EXTRACTION PHACO AND INTRAOCULAR LENS PLACEMENT ([place]);  Surgeon: Birder Robson, MD;  Location: [facility] ORS;  Service: Ophthalmology;  Laterality: Right;  [place] 00:41 CDE 5.29 Fluid pack lot # [id number]   CATARACT EXTRACTION W/PHACO Left 12/19/2017   Procedure: CATARACT EXTRACTION PHACO AND INTRAOCULAR LENS PLACEMENT (I[facility]);  Surgeon: Birder Robson, MD;  Location: [facility] ORS;  Service: Ophthalmology;  Laterality: Left;  [place] 00:33 CDE 3.94 Fluid pack lot # [id number]   CHOLECYSTECTOMY      FOOT SURGERY     HEMORROIDECTOMY     RCR  2006   SALPINGOOPHORECTOMY     TONSILLECTOMY      Family History  Problem Relation Age of Onset   Glaucoma Mother    Heart disease Mother    Heart disease Father    Migraines Father    Heart disease Sister    Hypertension Sister    Diabetes Sister    Diabetes Brother             Social History:  reports that she has never smoked. She has never used  smokeless tobacco. She reports that she does not currently use alcohol. She reports that she does not use drugs.  Allergies  Allergen Reactions   Erythromycin Shortness Of Breath and Rash   Amoxicillin    Bacitracin Dermatitis   Elemental Sulfur    Nitrofurantoin Rash   Penicillins Rash   Sulfa Antibiotics Rash    HOME MEDICATIONS:                                                                                                                      No current facility-administered medications on file prior to encounter.   Current Outpatient Medications on File Prior to Encounter  Medication Sig Dispense Refill   acetaminophen (TYLENOL) 325 MG tablet Take 650 mg by mouth every 6 (six) hours as needed.     aspirin 81 MG chewable tablet Chew by mouth daily.     citalopram (CELEXA) 20 MG tablet Take 20 mg by mouth daily.     diphenhydrAMINE (BENADRYL) 25 mg capsule Take 25 mg by mouth every 6 (six) hours as needed.     donepezil (ARICEPT) 5 MG tablet Take 5 mg by mouth at bedtime.     fluticasone (FLONASE) 50 MCG/ACT nasal spray Place into both nostrils daily.     lovastatin (MEVACOR) 20 MG tablet Take 20 mg by mouth at bedtime.     metoprolol tartrate (LOPRESSOR) 25 MG tablet Take 25 mg by mouth 2 (two) times daily.     nitroGLYCERIN (NITROSTAT) 0.4 MG SL tablet Place 0.4 mg under the tongue every 5 (five) minutes as needed for chest pain.     pantoprazole (PROTONIX) 40 MG tablet Take 40 mg by mouth daily.       ROS:                                                                                                                                        Denies any weakness, vision change, or current facial droop, No SOB. No vertigo or presncope. No confusion or trouble talking. Other ROS as per HPI.     Blood pressure (!) 119/98, pulse 69, temperature 98.6 F (37 C), temperature source Oral, resp. rate 18, SpO2 92 %.   General Examination:  Physical Exam  HEENT-  [place]/AT   Lungs- Respirations unlabored Extremities- No edema  Neurological Examination Mental Status: Awake and alert. Speech fluent with intact comprehension and naming. Oriented to the month, gets the year after one wrong answer, states today is "Friday", knows the correct city and state. Can give some history but is forgetful regarding portions. No dysarthria.  Cranial Nerves: II: Temporal visual fields intact with no extinction to DSS. PERRL.   III,IV, VI: No ptosis. EOMI. No nystagmus.  V,VII: Smile symmetric, facial temp sensation decreased subjectively on the right VIII: Mildly HOH IX,X: No hypophonia or hoarseness XI: Slight lag on the right with shoulder shrug XII: Midline tongue extension Motor: Right : Upper extremity   4+/5    Left:     Upper extremity   4+/5  Lower extremity   4+/5     Lower extremity   4+/5 No pronator drift Sensory: Light touch intact throughout, bilaterally. No extinction to DSS. Temp sensation decreased to RLE.  Deep Tendon Reflexes: 2+ and symmetric throughout, including achilles reflexes Plantars: Tonically upgoing toes Cerebellar: No ataxia with FNF bilaterally  Gait: Stooped and unsteady with contact guard   Lab Results: Basic Metabolic Panel: No results for input(s): NA, K, CL, CO2, GLUCOSE, BUN, CREATININE, CALCIUM, MG, PHOS in the last 168 hours.  CBC: No results for input(s): WBC, NEUTROABS, HGB, HCT, MCV, PLT in the last 168 hours.  Cardiac Enzymes: No results  for input(s): CKTOTAL, CKMB, CKMBINDEX, TROPONINI in the last 168 hours.  Lipid Panel: No results for input(s): CHOL, TRIG, HDL, CHOLHDL, VLDL, LDLCALC in the last 168 hours.  Imaging: No results found.   Assessment: 80 year old female presenting with a PMHx of B12 deficiency, brain aneurysm s/p coiling, CAD, depression, dyspnea on exertion, dysrhythmia, HTN, memory loss, osteoporosis and sleep apnea presenting from [facility] with left sided numbness, left facial droop and chest pain that started at 1600. Code Stroke was called in the ED.  1. Facial droop now resolved. Exam reveals mild right sided sensory loss. No limb weakness or ataxia noted.  2. NIHSS 2 (missed month on first attempt, mild right sided sensory loss).  3. CT head: No acute intracranial process. ASPECTS is 10 4. CTA of head and neck: Narrowing and complete occlusion of the right V2 segment, with distal reconstitution in the V3 segment. In addition, there is multifocal narrowing of the distal right V3 and V4 segments, which remain patent. There is likely stenosis at the origin of the right vertebral artery; however, evaluation is limited by beam hardening artifact from the adjacent venous bolus. No intracranial vessel occlusion. 5. Given history of aneurysm coiling and mild symptoms the risks of IV thrombolysis are felt to significantly outweigh potential benefits.  6. Most likely etiology for her presentation is TIA involving the left thalamus.   Recommendations: 1. Obtain records from her aneurysm coiling (done at [facility] in the 2000's per family, possibly 2005) and if coil is MRI compatible, obtain MRI brain.  2. TTE 3. Cardiac telemetry 4. PT/OT/Speech 5. Modified permissive HTN protocol given advanced age. Treat SBP if > 180.  6. Add Plavix to ASA.  7. Given advanced age, benefits of statin therapy are most likely outweighed by risks.  8. HgbA1c 9. Will need outpatient Neurology follow up with the [facility]  after discharge.   Electronically signed: Dr. Kerney Elbe 12/01/2020, 6:44 PM

## 2020-12-01 NOTE — ED Notes (Signed)
Patient return from MRI.

## 2020-12-01 NOTE — H&P (Signed)
History and Physical    Meghan Bell:[id number] DOB: 03/26/1940 DOA: 12/01/2020  PCP: Kirk Ruths, MD   Patient coming from: Home  I have personally briefly reviewed patient's relevant medical records in [place]  Chief Complaint: Left-sided weakness  HPI: Meghan Bell is a 80 y.o. female with medical history significant for Brain aneurysm s/p coiling, CAD, anxiety and depression, HTN, sleep apnea on CPAP who presents from the [place] [facility] as a code stroke with left-sided numbness and left facial droop that started at 1600.  Patient also complaining of substernal chest pain of moderate intensity, nonradiating.  She denies nausea, vomiting, diaphoresis or lightheadedness.  Has no cough, fever or chills or shortness of breath. By the time of my evaluation patient had no chest pain.  Her left-sided numbness had for the most part resolved  ED code stroke course: Patient's vitals were within normal limits except for BP of 119/98 on arrival NIH of 2 CT head: No acute intracranial process CTA head and neck occlusion of right V2 segment with distal reconstitution in the V3 segment.  Multifocal narrowing of the V3 and V4 segments Teleneurology assessment: Likely TIA involving the left thalamus tPA eligibility:" Given history of aneurysmal coiling and mild symptoms, the risks of IV thrombolysis are felt to significantly outweigh potential benefits closed"  Hospitalist consulted for admission for stroke work-up as recommended by neurologist, Dr. Cheral Marker, including MRI if coil is MRI compatible    Review of Systems: As per HPI otherwise all other systems on review of systems negative.    Past Medical History:  Diagnosis Date   Arthritis    B12 deficiency    Brain aneurysm    Cancer ([place])    SKIN-non-melanoma   Coronary artery disease    Depression    Dyspnea    DOE   Dysrhythmia    GERD (gastroesophageal reflux disease)    Hammer toe of right foot     second toe   Heart murmur    Hypertension    Memory loss    Osteoporosis    Sleep apnea     Past Surgical History:  Procedure Laterality Date   ABDOMINAL HYSTERECTOMY  1972   APPENDECTOMY     BLADDER SURGERY  2011   BRAIN SURGERY     ANEURTSM WITH COIL   BREAST SURGERY Left 2002   LUMPECTOMY   CATARACT EXTRACTION W/PHACO Right 11/21/2017   Procedure: CATARACT EXTRACTION PHACO AND INTRAOCULAR LENS PLACEMENT ([place]);  Surgeon: Birder Robson, MD;  Location: [facility] ORS;  Service: Ophthalmology;  Laterality: Right;  [place] 00:41 CDE 5.29 Fluid pack lot # [id number]   CATARACT EXTRACTION W/PHACO Left 12/19/2017   Procedure: CATARACT EXTRACTION PHACO AND INTRAOCULAR LENS PLACEMENT (IOC);  Surgeon: Birder Robson, MD;  Location: [facility] ORS;  Service: Ophthalmology;  Laterality: Left;  [place] 00:33 CDE 3.94 Fluid pack lot # [id number]   CHOLECYSTECTOMY     FOOT SURGERY     HEMORROIDECTOMY     RCR  2006   SALPINGOOPHORECTOMY     TONSILLECTOMY       reports that she has never smoked. She has never used smokeless tobacco. She reports that she does not currently use alcohol. She reports that she does not use drugs.  Allergies  Allergen Reactions   Erythromycin Shortness Of Breath and Rash   Amoxicillin    Bacitracin Dermatitis   Elemental Sulfur    Nitrofurantoin Rash   Penicillins Rash   Sulfa Antibiotics Rash  Family History  Problem Relation Age of Onset   Glaucoma Mother    Heart disease Mother    Heart disease Father    Migraines Father    Heart disease Sister    Hypertension Sister    Diabetes Sister    Diabetes Brother       Prior to Admission medications   Medication Sig Start Date End Date Taking? Authorizing Provider  acetaminophen (TYLENOL) 325 MG tablet Take 650 mg by mouth every 6 (six) hours as needed.    [provider]  aspirin 81 MG chewable tablet Chew by mouth daily.    [provider]  citalopram (CELEXA) 20 MG tablet Take 20 mg by mouth  daily.    [provider]  diphenhydrAMINE (BENADRYL) 25 mg capsule Take 25 mg by mouth every 6 (six) hours as needed.    [provider]  donepezil (ARICEPT) 5 MG tablet Take 5 mg by mouth at bedtime.    [provider]  fluticasone (FLONASE) 50 MCG/ACT nasal spray Place into both nostrils daily.    [provider]  lovastatin (MEVACOR) 20 MG tablet Take 20 mg by mouth at bedtime.    [provider]  metoprolol tartrate (LOPRESSOR) 25 MG tablet Take 25 mg by mouth 2 (two) times daily.    [provider]  nitroGLYCERIN (NITROSTAT) 0.4 MG SL tablet Place 0.4 mg under the tongue every 5 (five) minutes as needed for chest pain.    [provider]  pantoprazole (PROTONIX) 40 MG tablet Take 40 mg by mouth daily.    [provider]    Physical Exam: Vitals:   12/01/20 1930 12/01/20 2000 12/01/20 2030 12/01/20 2100  BP: (!) 143/70 135/74 (!) 153/81 127/86  Pulse: 68 62 60 70  Resp: 13 16 15  (!) 21  Temp:      TempSrc:      SpO2: 100% 95% 97% 98%   Constitutional: Alert and oriented x 3 . Not in any apparent distress HEENT:      Head: Normocephalic and atraumatic.         Eyes: PERLA, EOMI, Conjunctivae are normal. Sclera is non-icteric.       Mouth/Throat: Mucous membranes are moist.       Neck: Supple with no signs of meningismus. Cardiovascular: Regular rate and rhythm. No murmurs, gallops, or rubs. 2+ symmetrical distal pulses are present . No JVD. No  LE edema Respiratory: Respiratory effort normal .Lungs sounds clear bilaterally. No wheezes, crackles, or rhonchi.  Gastrointestinal: Soft, non tender, non distended. Positive bowel sounds.  Genitourinary: No CVA tenderness. Musculoskeletal: Nontender with normal range of motion in all extremities. No cyanosis, or erythema of extremities. Neurologic:  Face is symmetric. Moving all extremities. No gross focal neurologic deficits . Skin: Skin is warm, dry.  No rash or  ulcers Psychiatric: Mood and affect are appropriate    Labs on Admission: I have personally reviewed following labs and imaging studies  CBC: Recent Labs  Lab 12/01/20 1946  WBC 8.1  NEUTROABS 4.7  HGB 12.2  HCT 35.9*  MCV 99.4  PLT 702   Basic Metabolic Panel: Recent Labs  Lab 12/01/20 1946  NA 136  K 3.8  CL 105  CO2 21*  GLUCOSE 96  BUN 9  CREATININE 0.65  CALCIUM 8.6*   GFR: CrCl cannot be calculated (Unknown ideal weight.). Liver Function Tests: Recent Labs  Lab 12/01/20 1946  AST 26  ALT 24  ALKPHOS 64  BILITOT  0.8  PROT 6.7  ALBUMIN 3.8   No results for input(s): LIPASE, AMYLASE in the last 168 hours. No results for input(s): AMMONIA in the last 168 hours. Coagulation Profile: No results for input(s): INR, PROTIME in the last 168 hours. Cardiac Enzymes: No results for input(s): CKTOTAL, CKMB, CKMBINDEX, TROPONINI in the last 168 hours. BNP (last 3 results) No results for input(s): PROBNP in the last 8760 hours. HbA1C: No results for input(s): HGBA1C in the last 72 hours. CBG: Recent Labs  Lab 12/01/20 1829  GLUCAP 123*   Lipid Profile: No results for input(s): CHOL, HDL, LDLCALC, TRIG, CHOLHDL, LDLDIRECT in the last 72 hours. Thyroid Function Tests: No results for input(s): TSH, T4TOTAL, FREET4, T3FREE, THYROIDAB in the last 72 hours. Anemia Panel: No results for input(s): VITAMINB12, FOLATE, FERRITIN, TIBC, IRON, RETICCTPCT in the last 72 hours. Urine analysis:    Component Value Date/Time   COLORURINE Colorless 05/16/2014 1514   APPEARANCEUR Clear 05/16/2014 1514   LABSPEC 1.002 05/16/2014 1514   PHURINE 7.0 05/16/2014 1514   GLUCOSEU Negative 05/16/2014 1514   HGBUR Negative 05/16/2014 1514   BILIRUBINUR Negative 05/16/2014 1514   KETONESUR Negative 05/16/2014 1514   PROTEINUR Negative 05/16/2014 1514   NITRITE Negative 05/16/2014 1514   LEUKOCYTESUR Negative 05/16/2014 1514    Radiological Exams on Admission: CT Angio Chest  Aorta W and/or Wo Contrast  Result Date: 12/01/2020 CLINICAL DATA:  Left-sided numbness and chest pain beginning at 4 p.m. today. Left sided facial droop. EXAM: CT ANGIOGRAPHY CHEST WITH CONTRAST TECHNIQUE: Multidetector CT imaging of the chest was performed using the standard protocol during bolus administration of intravenous contrast. Multiplanar CT image reconstructions and MIPs were obtained to evaluate the vascular anatomy. CONTRAST:  162mL OMNIPAQUE IOHEXOL 350 MG/ML SOLN COMPARISON:  12/17/2008.  Chest radiograph, 10/14/2016. FINDINGS: Cardiovascular: Heart is normal in size and configuration. No pericardial effusion. Mild three-vessel coronary artery calcifications. Great vessels are normal in caliber. No aortic dissection or atherosclerosis. Minor atherosclerotic plaque at the origin of the left subclavian artery. Arch branch vessels are widely patent. Mediastinum/Nodes: Normal sized thyroid with small hypoattenuating nodules, largest 1 cm. Findings are similar to the prior CT. Not clinically significant; no follow-up imaging recommended (ref: J Am Coll Radiol. 2015 Feb;12(2): 143-50). No mediastinal or hilar masses. No enlarged lymph nodes. Trachea and esophagus are unremarkable. Lungs/Pleura: Scarring and bronchiectasis in the medial right lower lobe. Mild linear reticular scarring the left lung base. No evidence of pneumonia or pulmonary edema. No lung mass or nodule. No pleural effusion or pneumothorax. Upper Abdomen: 1.4 cm low-attenuation lesion with peripheral calcification the liver dome replaces the large cystic mass noted on the prior CT. 2 cm cyst in the left lobe, mildly increased in size from prior CT. There are no acute findings in the visualized upper abdomen. Musculoskeletal: No fracture or acute finding.  No bone lesion. Review of the MIP images confirms the above findings. IMPRESSION: 1. Normal appearance of the thoracic aorta.  No dissection. 2. Minor atherosclerotic plaque at the  origin of the left subclavian artery. No hemodynamically significant stenosis the arch branch vessels. 3. No acute abnormalities within the chest. Electronically Signed   By: Lajean Manes M.D.   On: 12/01/2020 19:14   CT HEAD CODE STROKE WO CONTRAST  Result Date: 12/01/2020 CLINICAL DATA:  Code stroke. Left-sided numbness and chest pain, left-sided facial droop EXAM: CT HEAD WITHOUT CONTRAST TECHNIQUE: Contiguous axial images were obtained from the base of the skull through the vertex  without intravenous contrast. COMPARISON:  None. FINDINGS: Brain: No evidence of acute infarction, hemorrhage, cerebral edema, mass, mass effect, or midline shift. Ventricles and sulci are normal for age. No extra-axial fluid collection. Vascular: No hyperdense vessel or unexpected calcification. Skull: Normal. Negative for fracture or focal lesion. Hyperostosis frontalis Sinuses/Orbits: No acute finding. Status post bilateral lens replacements. Other: The mastoid air cells are well aerated. ASPECTS [facility] Stroke Program Early CT Score) - Ganglionic level infarction (caudate, lentiform nuclei, internal capsule, insula, M1-M3 cortex): 7 - Supraganglionic infarction (M4-M6 cortex): 3 Total score (0-10 with 10 being normal): 10 IMPRESSION: 1. No acute intracranial process. 2. ASPECTS is 10 Code stroke imaging results were communicated on 12/01/2020 at 6:44 pm to provider Dr. Quentin Cornwall via telephone, who verbally acknowledged these results. Electronically Signed   By: Merilyn Baba M.D.   On: 12/01/2020 18:44   CT ANGIO HEAD NECK W WO CM (CODE STROKE)  Result Date: 12/01/2020 CLINICAL DATA:  Facial numbness EXAM: CT ANGIOGRAPHY HEAD AND NECK TECHNIQUE: Multidetector CT imaging of the head and neck was performed using the standard protocol during bolus administration of intravenous contrast. Multiplanar CT image reconstructions and MIPs were obtained to evaluate the vascular anatomy. Carotid stenosis measurements (when applicable)  are obtained utilizing NASCET criteria, using the distal internal carotid diameter as the denominator. CONTRAST:  161mL OMNIPAQUE IOHEXOL 350 MG/ML SOLN COMPARISON:  Same day CT head. FINDINGS: CT HEAD FINDINGS Please see same day CT head without contrast. CTA NECK FINDINGS Aortic arch: Standard branching. Imaged portion shows no evidence of aneurysm or dissection. No significant stenosis of the major arch vessel origins. Right carotid system: No evidence of dissection, stenosis (50% or greater) or occlusion. Left carotid system: No evidence of dissection, stenosis (50% or greater) or occlusion. Vertebral arteries: Evaluation of the origin the right vertebral artery is limited by beam hardening artifact from adjacent venous bolus. Within this limitation, there is likely stenosis at the origin of the right vertebral artery, which is diminutive and becomes non-opacified in the mid V2 segment (series 7, images 139-150), with distal reconstitution in the V3 segment (series 7, image 199). The left vertebral artery is patent from the origin to the V4 segment. Skeleton: Multilevel degenerative changes in the cervical spine, with osseous fusion of C5 and C6. Reversal of the normal cervical lordosis. Other neck: Multiple hypoattenuating nodules in the thyroid, right greater than left lobe, the largest of which measures up to 1.0 cm. Upper chest: No focal pulmonary opacity or pleural effusion. 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 Suspect right cavernous ICA stent, which appears patent. Both internal carotid arteries are patent to the termini, without stenosis or other abnormality. A1 segments patent. Normal anterior communicating artery. Anterior cerebral arteries are patent to their distal aspects. No M1 stenosis or occlusion. Normal MCA bifurcations. Distal MCA branches perfused and symmetric. Posterior circulation: Narrowing  of the distal right V3 and V4 segments (series 7, image 200, 214, 222). Left vertebral artery is dpatent to the vertebrobasilar junction without stenosis. Posterior inferior cerebral arteries patent bilaterally. Basilar patent to its distal aspect. Superior cerebral arteries patent bilaterally. PCAs well perfused to their distal aspects without stenosis. Venous sinuses: As permitted by contrast timing, patent. Anatomic variants: Bilateral posterior communicating arteries are not visualized. Review of the MIP images confirms the above findings IMPRESSION: 1. Narrowing and complete occlusion of the right V2 segment, with distal reconstitution in the  V3 segment. In addition, there is multifocal narrowing of the distal right V3 and V4 segments, which remain patent. There is likely stenosis at the origin of the right vertebral artery; however, evaluation is limited by beam hardening artifact from the adjacent venous bolus. 2. No intracranial vessel occlusion. Electronically Signed   By: Merilyn Baba M.D.   On: 12/01/2020 19:22    Assessment/Plan Active Problems:   CVA (cerebral vascular accident) ([place])   H/O cerebral aneurysm repair - Recommendations per teleneurology consult - TTE, cardiac monitoring, PT/OT/speech - Add Plavix to aspirin for overlap - Lipid profile and HbA1c - Neurology to follow    Essential hypertension - Permissive hypertension to systolic 622    Chest pain   Coronary artery disease - Patient with chest tightness, EKG nonacute and troponin of 3 - We will continue to trend - Continue home aspirin and lovastatin and metoprolol   OSA -CPAP nightly    Controlled type 2 diabetes mellitus - Sliding scale insulin     DVT prophylaxis: Lovenox  Code Status: full code  Family Communication: GrandDaughter at bedside Disposition Plan: Back to previous home environment Consults called: neurology  Status:At the time of admission, it appears that the appropriate admission status  for this patient is INPATIENT. This is judged to be reasonable and necessary in order to provide the required intensity of service to ensure the patient's safety given the presenting symptoms, physical exam findings, and initial radiographic and laboratory data in the context of their  Comorbid conditions.   Patient requires inpatient status due to high intensity of service, high risk for further deterioration and high frequency of surveillance required.   I certify that at the point of admission it is my clinical judgment that the patient will require inpatient hospital care spanning beyond [place] MD [facility]   12/01/2020, 9:24 PM

## 2020-12-01 NOTE — ED Notes (Signed)
Report received from Brianna, RN 

## 2020-12-01 NOTE — ED Notes (Signed)
Patient in MRI 

## 2020-12-01 NOTE — ED Provider Notes (Signed)
Emergency Medicine Provider Triage Evaluation Note  Meghan Bell , a 80 y.o. female  was evaluated in triage.  Pt complains of headache, left-sided numbness and weakness, chest pain.  Patient began with symptoms at 5:00, number 28 months ago.  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  No history of CVA.  No history of MI.  No recent illnesses.  At 4:00 this afternoon daughter had seen the patient and she was asymptomatic, feeling normal.  Review of Systems  Positive: Headache, left-sided facial droop, left arm/side weakness and numbness, chest pain.  Positive for nausea, no emesis. Negative: Visual changes, URI symptoms, shortness of breath, abdominal pain, emesis, diarrhea constipation  Physical Exam  BP (!) 119/98   Pulse 69   Resp 18   SpO2 92%  Gen:   Awake, no distress   Resp:  Normal effort  MSK:   Moves extremities without difficulty  Other:  Left-sided facial droop identified.  New according to the daughter.  Some nystagmus on cranial nerve testing, slight pronator drift on left side, weakness of the left hand when compared with right  Medical Decision Making  Medically screening exam initiated at 6:28 PM.  Appropriate orders placed.  Darla Lesches was informed that the remainder of the evaluation will be completed by another provider, this initial triage assessment does not replace that evaluation, and the importance of remaining in the ED until their evaluation is complete.  Code stroke called.  Patient presented with sudden onset of headache, left-sided weakness and numbness, chest pain.  History of previous brain aneurysm repaired.  No history of CVA or STEMI.  Patient did have nystagmus, pronator drift, weakness of the left upper extremity when compared with right.  Code stroke called at this time.   Darletta Moll, PA-C 12/01/20 1845    Merlyn Lot, MD 12/01/20 1931

## 2020-12-01 NOTE — ED Triage Notes (Signed)
Pt to ED from [facility] for left sided numbness and chest pain that started 1600 Left sided facial droop Code stroke per [place] PA

## 2020-12-01 NOTE — Progress Notes (Signed)
Code Stroke page; Chaplain spoke with patient and her granddaughter, family shared her spouse was pastor for many year, patient had a long marriage. Chaplain offered spiritual and emotional support. Prayer for calmness and comfort.

## 2020-12-02 ENCOUNTER — Inpatient Hospital Stay
Admit: 2020-12-02 | Discharge: 2020-12-02 | Disposition: A | Discharge status: Home / Self Care | Payer: Medicare PPO | Attending: Internal Medicine | Admitting: Internal Medicine

## 2020-12-02 DIAGNOSIS — G459 Transient cerebral ischemic attack, unspecified: Secondary | ICD-10-CM | POA: Diagnosis present

## 2020-12-02 LAB — HEMOGLOBIN A1C
Hgb A1c MFr Bld: 6.4 % — ABNORMAL HIGH (ref 4.8–5.6)
Mean Plasma Glucose: 136.98 mg/dL

## 2020-12-02 LAB — ECHOCARDIOGRAM COMPLETE
AR max vel: 2.06 cm2
AV Area VTI: 2.01 cm2
AV Area mean vel: 2.01 cm2
AV Mean grad: 4 mmHg
AV Peak grad: 6.9 mmHg
Ao pk vel: 1.31 m/s
Area-P 1/2: 3.56 cm2
Height: 62 in
MV VTI: 2.27 cm2
S' Lateral: 2.6 cm
Weight: 2320 oz

## 2020-12-02 LAB — LIPID PANEL
Cholesterol: 195 mg/dL (ref 0–200)
HDL: 64 mg/dL (ref >40)
LDL Cholesterol: 107 mg/dL — ABNORMAL HIGH (ref 0–99)
Total CHOL/HDL Ratio: 3 RATIO
Triglycerides: 119 mg/dL (ref <150)
VLDL: 24 mg/dL (ref 0–40)

## 2020-12-02 MED ORDER — PANTOPRAZOLE SODIUM 40 MG PO TBEC: 40.0000 mg | DELAYED_RELEASE_TABLET | Freq: Every day | ORAL | 0 refills | Status: AC

## 2020-12-02 MED ORDER — CYANOCOBALAMIN 1000 MCG/ML IJ SOLN
1000.0000 ug | Freq: Once | INTRAMUSCULAR | Status: AC
Start: 2020-12-02 — End: 2020-12-02
  Administered 2020-12-02: 1000 ug via INTRAMUSCULAR
  Filled 2020-12-02: qty 1

## 2020-12-02 MED ORDER — PANTOPRAZOLE SODIUM 40 MG PO TBEC
40.0000 mg | DELAYED_RELEASE_TABLET | Freq: Two times a day (BID) | ORAL | Status: DC
  Administered 2020-12-02: 40 mg via ORAL
  Filled 2020-12-02: qty 1

## 2020-12-02 MED ORDER — CLOPIDOGREL BISULFATE 75 MG PO TABS: 75.0000 mg | ORAL_TABLET | Freq: Every day | ORAL | 2 refills | Status: AC

## 2020-12-02 MED ORDER — PERFLUTREN LIPID MICROSPHERE
1.0000 mL | INTRAVENOUS | Status: AC | PRN
  Administered 2020-12-02: 2 mL via INTRAVENOUS
  Filled 2020-12-02: qty 10

## 2020-12-02 NOTE — ED Notes (Signed)
Messaged IPMD: 3 medication questions: I did not give the [place] due now d/t heart rate of 54, it has since gone up to 71, it seems to be up and down.  Should I hold?  Plavix due at 10 am as well 1x day but pt had at 1am.  Holding both of these until I hear from you.  Grandaughter in room states Aricept is supposed to be 10mg  nightly, MAR showing 5mg .

## 2020-12-02 NOTE — Care Management CC44 (Signed)
Condition Code 44 Documentation Completed  Patient Details  Name: Meghan Bell MRN: [id number] Date of Birth: Nov 24, 1940   Condition Code 44 given:    Patient signature on Condition Code 44 notice:    Documentation of 2 MD's agreement:    Code 44 added to claim:       Laurena Slimmer, RN 12/02/2020, 6:38 PM

## 2020-12-02 NOTE — Evaluation (Signed)
Occupational Therapy Evaluation Patient Details Name: Meghan Bell MRN: [id number] DOB: July 04, 1940 Today's Date: 12/02/2020   History of Present Illness Pt is an 80 y.o. female with medical history significant for brain aneurysm s/p coiling, CAD, anxiety and depression, HTN, sleep apnea on CPAP who presents from the [place] [facility] as a code stroke with left-sided numbness and left facial droop. Per neurology note, no acute intracranial abnormality and mild volume loss and chronic small vessel ischemic changes.   Clinical Impression   Pt seen for OT evaluation this date to f/u re: safety with ADLs/ADL mobility. Pt reports being INDEP at baseline, but with several children/grand children that check on her regularly. She lives alone and reports being able to drive to run her own errands although often, her kids will drive her. Pt presents this date nearing her functional baseline. She reports she feels like herself. OT assesses vision, UE ROM, sensation, strength and coordination. Only focal deficit appreciated on this examination was slightly decreased speed on L side during rapid alternating movements test, + for dysdiadochokinesia. Pt able to perform all self care with MOD I, does require increased time esp on L side. OT addresses role of OT and safety considerations including driving-recommend pt f/u with neruo. OT will continue to follow acutely and anticipate that pt would benefit from OP OT f/u to improve coordination as it applies to ADLs/IADLs.      Recommendations for follow up therapy are one component of a multi-disciplinary discharge planning process, led by the attending physician.  Recommendations may be updated based on patient status, additional functional criteria and insurance authorization.   Follow Up Recommendations  Outpatient OT    Assistance Recommended at Discharge PRN  Functional Status Assessment  Patient has had a recent decline in their functional status and  demonstrates the ability to make significant improvements in function in a reasonable and predictable amount of time.  Equipment Recommendations  Tub/shower seat    Recommendations for Other Services       Precautions / Restrictions Precautions Precautions: Fall Restrictions Weight Bearing Restrictions: No      Mobility Bed Mobility Overal bed mobility: Modified Independent                  Transfers Overall transfer level: Needs assistance Equipment used: None Transfers: Sit to/from Stand Sit to Stand: Supervision           General transfer comment: SUPV for safety, no LOB      Balance     Sitting balance-Leahy Scale: Good       Standing balance-Leahy Scale: Fair                             ADL either performed or assessed with clinical judgement   ADL Overall ADL's : Modified independent;At baseline                                       General ADL Comments: requires SETUP for UB/LB ADLs, appears to be close to baseline, requries increased time     Vision Patient Visual Report: No change from baseline Vision Assessment?: Yes Eye Alignment: Within Functional Limits Ocular Range of Motion: Within Functional Limits Alignment/Gaze Preference: Within Defined Limits     Perception     Praxis      Pertinent Vitals/Pain Pain Assessment: 0-10 Pain Score:  5  Pain Location: sternal area Pain Descriptors / Indicators: Aching Pain Intervention(s): Monitored during session;Premedicated before session     Hand Dominance Right   Extremity/Trunk Assessment Upper Extremity Assessment Upper Extremity Assessment: RUE deficits/detail;LUE deficits/detail RUE Deficits / Details: ROM WFL, MMT grossly 4-/5, sensation WFL, some decreased [place] bilaterally, pt states generally decreased d/t h/o typing for work and painful arthritic knuckles. RUE Sensation: WNL RUE Coordination: decreased fine motor LUE Deficits / Details: only  notable difference/discrepancy from R to L side:  slower movement of L hand on rapid alternating movements test, + dysdiadochokinesia. ROM WFL, MMT grossly 4-/5. Sensation WFL. Slow to answer sensation questions, but correctly names all sites touched while eyes closed. Pt with some decreased [place], but not different from R hand in that, she has decreased painful, arthritic knuckles frmo h/o typing for work LUE Sensation: WNL LUE Coordination: decreased fine motor   Lower Extremity Assessment Lower Extremity Assessment: Defer to PT evaluation;Generalized weakness       Communication Communication Communication: No difficulties   Cognition Arousal/Alertness: Awake/alert Behavior During Therapy: WFL for tasks assessed/performed Overall Cognitive Status: Within Functional Limits for tasks assessed                                       General Comments       Exercises Other Exercises Other Exercises: OT engaes pt in ed re: role of OT, OP OT f/u for L hand coordination, safety considerations including driving considerations-pt reports plans to f/u with OP neurology.   Shoulder Instructions      Home Living Family/patient expects to be discharged to:: Private residence Living Arrangements: Alone Available Help at Discharge: Family;Available 24 hours/day Type of Home: House Home Access: Stairs to enter [employer] of Steps: 3 Entrance Stairs-Rails: Right;Left;Can reach both Home Layout: One level     Bathroom Shower/Tub: Occupational psychologist: Standard     Home Equipment: Conservation officer, nature (2 wheels);Cane - quad;Shower seat - built in;Grab bars - tub/shower      Lives With: Alone    Prior Functioning/Environment Prior Level of Function : Independent/Modified Independent             Mobility Comments: Ind amb community distances without an AD, Ind with ADLs, drives, one fall in the last 6 months secondary to LOB          OT Problem  List: Decreased coordination      OT Treatment/Interventions: Self-care/ADL training;Therapeutic activities;Therapeutic exercise;Neuromuscular education    OT Goals(Current goals can be found in the care plan section) Acute Rehab OT Goals Patient Stated Goal: to go home OT Goal Formulation: With patient/family Time For Goal Achievement: 12/16/20 Potential to Achieve Goals: Good ADL Goals Additional ADL Goal #1: Pt will demo improved bimanual coordination as evidenced by success on 4/5 trials to pass fine motor objects hand to hand  OT Frequency: Min 1X/week   Barriers to D/C:            Co-evaluation              AM-PAC OT "6 Clicks" Daily Activity     Outcome Measure Help from another person eating meals?: None Help from another person taking care of personal grooming?: None Help from another person toileting, which includes using toliet, bedpan, or urinal?: None Help from another person bathing (including washing, rinsing, drying)?: A Little Help from another  person to put on and taking off regular upper body clothing?: None Help from another person to put on and taking off regular lower body clothing?: None 6 Click Score: 23   End of Session Equipment Utilized During Treatment: Gait belt Nurse Communication: Mobility status  Activity Tolerance: Patient tolerated treatment well Patient left: in bed;with call bell/phone within reach;with family/visitor present  OT Visit Diagnosis: Other (comment) (R27.8 other lack of coordination)                Time: 3009-7949 OT Time Calculation (min): 9 min Charges:  OT General Charges $OT Visit: 1 Visit OT Evaluation $OT Eval Moderate Complexity: [place], [place], OTR/L ascom [phone] 12/02/20, 5:42 PM

## 2020-12-02 NOTE — Discharge Summary (Signed)
Physician Discharge Summary  Meghan Bell DGU:[id number] DOB: Jul 05, 1940 DOA: 12/01/2020  PCP: Kirk Ruths, MD  Admit date: 12/01/2020 Discharge date: 12/02/2020  Admitted From: Home Disposition: Home with outpatient therapies  Recommendations for Outpatient Follow-up:  Follow up with PCP in 1-2 weeks Follow-up with your neurology in 2 to 4 weeks.  Home Health: N/A Equipment/Devices: N/A  Discharge Condition: Stable CODE STATUS: Full code Diet recommendation: Low-salt diet  Discharge summary:  80 year old with history of B12 deficiency, brain aneurysm status post coiling, coronary artery disease, depression, hypertension, memory loss and osteoporosis went to [facility] clinic with left-sided numbness, left facial droop and dull chest pain starting in the evening and sent to ER.  Code stroke was called in the ER.  All neurological symptoms were improved by the time she arrived to ER.  Was admitted to work-up as TIA.  TIA with transient left-sided weakness.  Neurologically normal now. Clinical findings included left-sided transient weakness. CT head findings, normal. MRI of the brain, no acute stroke.  CTA of the head and neck, narrowing and complete occlusion of the right V2 segment but reconstitution.  No large vessel occlusion. 2D echocardiogram, essentially normal.  No aneurysms/thrombus.  Normal ejection fraction. Antiplatelet therapy, on aspirin at home.  Neurology recommended to add aspirin and Plavix indefinitely. LDL 107.  Advised to continue lovastatin.  Hemoglobin A1c, 6.4.  No indication for treatment. Therapy recommendations, outpatient physical therapy.  Referral made. Resume all antihypertensive medications as there is no evidence of large vessel occlusion. Seen and cleared by neurology.  Added Plavix along with aspirin. She does follow-up with neurology at [facility] clinic with Dr. Manuella Ghazi, they will make appointment for follow-up.  Chest pain: Atypical.  Does  have a history of reflux.  She is on Protonix 40 mg once daily.  Troponins, EKG were nonischemic.  Echocardiogram without any regional wall abnormality.  Atypical chest pain.  Acute coronary syndrome ruled out.  Will increase dose of Protonix to 40 mg twice daily.  Patient was admitted to hospital with acute stroke symptoms for work-up.  Patient was able to complete her work-up in the emergency room within 24 hours including all imaging studies, MRI, neurology studies and echocardiogram.  She was also seen by neurology/rehab therapies as expedited care and was able to be discharged home today.  Patient can resume all her long-term medications.    Discharge Diagnoses:  Active Problems:   H/O cerebral aneurysm repair   OSA on CPAP   Essential hypertension   Coronary artery disease involving native coronary artery of native heart with angina pectoris (HCC)   Controlled type 2 diabetes mellitus with complication, without long-term current use of insulin (HCC)   Anxiety state   CVA (cerebral vascular accident) ([place])   Chest pain   TIA (transient ischemic attack)    Discharge Instructions  Discharge Instructions     Ambulatory referral to Neurology   Complete by: As directed    Ambulatory referral to Physical Therapy   Complete by: As directed    Call MD for:   Complete by: As directed    Any recurrent problem , weakness or more chest pain   Call MD for:  difficulty breathing, headache or visual disturbances   Complete by: As directed    Diet - low sodium heart healthy   Complete by: As directed    Increase activity slowly   Complete by: As directed       Allergies as of 12/02/2020  Reactions   Amoxicillin Other (See Comments)   Other reaction(s): Other (See Comments) Other Reaction: GI Upset Other Reaction: GI Upset   Erythromycin Shortness Of Breath, Rash   Influenza Vaccines Rash, Shortness Of Breath   Bacitracin Dermatitis   Elemental Sulfur    Other    Other  reaction(s): Other (See Comments), Other (See Comments) Crestor, Lipitor, Pravachol, Zocor   - myalgias Crestor, Lipitor, Pravachol, Zocor   - myalgias   Nitrofurantoin Rash   Other reaction(s): Other (See Comments) DIFFICULTY BREATHING and rash   Penicillins Rash   Sulfa Antibiotics Rash        Medication List     TAKE these medications    acetaminophen 325 MG tablet Commonly known as: TYLENOL Take 650 mg by mouth every 6 (six) hours as needed.   aspirin 81 MG chewable tablet Chew 81 mg by mouth daily.   citalopram 20 MG tablet Commonly known as: CELEXA Take 10-20 mg by mouth daily.   clopidogrel 75 MG tablet Commonly known as: PLAVIX Take 1 tablet (75 mg total) by mouth daily. Start taking on: December 03, 2020   diphenhydrAMINE 25 mg capsule Commonly known as: BENADRYL Take 25 mg by mouth every 6 (six) hours as needed.   donepezil 10 MG tablet Commonly known as: ARICEPT Take 10 mg by mouth at bedtime.   fluticasone 50 MCG/ACT nasal spray Commonly known as: FLONASE Place into both nostrils daily.   lovastatin 20 MG tablet Commonly known as: MEVACOR Take 20 mg by mouth at bedtime.   metoprolol succinate 25 MG 24 hr tablet Commonly known as: TOPROL-XL Take 25 mg by mouth daily.   nitroGLYCERIN 0.4 MG SL tablet Commonly known as: NITROSTAT Place 0.4 mg under the tongue every 5 (five) minutes as needed for chest pain.   pantoprazole 40 MG tablet Commonly known as: PROTONIX Take 1 tablet (40 mg total) by mouth daily.        Follow-up Information     Kirk Ruths, MD Follow up in 2 week(s).   Specialty: Internal Medicine Contact information: [address] [facility] [place]  [address] [phone]         Vladimir Crofts, MD Follow up.   Specialty: Neurology Why: call to schedule follow up Contact information: [facility] [place] [address] [phone]                 Allergies  Allergen Reactions   Amoxicillin Other (See Comments)    Other reaction(s): Other (See Comments) Other Reaction: GI Upset Other Reaction: GI Upset    Erythromycin Shortness Of Breath and Rash   Influenza Vaccines Rash and Shortness Of Breath   Bacitracin Dermatitis   Elemental Sulfur    Other     Other reaction(s): Other (See Comments), Other (See Comments) Crestor, Lipitor, Pravachol, Zocor   - myalgias Crestor, Lipitor, Pravachol, Zocor   - myalgias    Nitrofurantoin Rash    Other reaction(s): Other (See Comments) DIFFICULTY BREATHING and rash    Penicillins Rash   Sulfa Antibiotics Rash    Consultations: Neurology   Procedures/Studies: MR BRAIN WO CONTRAST  Result Date: 12/01/2020 CLINICAL DATA:  Acute neurologic deficit EXAM: MRI HEAD WITHOUT CONTRAST TECHNIQUE: Multiplanar, multiecho pulse sequences of the brain and surrounding structures were obtained without intravenous contrast. COMPARISON:  CTA head neck 12/01/2020 FINDINGS: Brain: No acute infarct, mass effect or extra-axial collection. No acute or chronic hemorrhage. There is  multifocal hyperintense T2-weighted signal within the white matter. Generalized volume loss without a clear lobar predilection. The midline structures are normal. Vascular: Major flow voids are preserved. Skull and upper cervical spine: Normal calvarium and skull base. Visualized upper cervical spine and soft tissues are normal. Sinuses/Orbits:No paranasal sinus fluid levels or advanced mucosal thickening. No mastoid or middle ear effusion. Normal orbits. IMPRESSION: 1. No acute intracranial abnormality. 2. Mild volume loss and chronic small vessel ischemic changes. Electronically Signed   By: Ulyses Jarred M.D.   On: 12/01/2020 23:45   CT Angio Chest Aorta W and/or Wo Contrast  Result Date: 12/01/2020 CLINICAL DATA:  Left-sided numbness and chest pain beginning at 4 p.m. today. Left sided facial droop. EXAM: CT ANGIOGRAPHY  CHEST WITH CONTRAST TECHNIQUE: Multidetector CT imaging of the chest was performed using the standard protocol during bolus administration of intravenous contrast. Multiplanar CT image reconstructions and MIPs were obtained to evaluate the vascular anatomy. CONTRAST:  185mL OMNIPAQUE IOHEXOL 350 MG/ML SOLN COMPARISON:  12/17/2008.  Chest radiograph, 10/14/2016. FINDINGS: Cardiovascular: Heart is normal in size and configuration. No pericardial effusion. Mild three-vessel coronary artery calcifications. Great vessels are normal in caliber. No aortic dissection or atherosclerosis. Minor atherosclerotic plaque at the origin of the left subclavian artery. Arch branch vessels are widely patent. Mediastinum/Nodes: Normal sized thyroid with small hypoattenuating nodules, largest 1 cm. Findings are similar to the prior CT. Not clinically significant; no follow-up imaging recommended (ref: J Am Coll Radiol. 2015 Feb;12(2): 143-50). No mediastinal or hilar masses. No enlarged lymph nodes. Trachea and esophagus are unremarkable. Lungs/Pleura: Scarring and bronchiectasis in the medial right lower lobe. Mild linear reticular scarring the left lung base. No evidence of pneumonia or pulmonary edema. No lung mass or nodule. No pleural effusion or pneumothorax. Upper Abdomen: 1.4 cm low-attenuation lesion with peripheral calcification the liver dome replaces the large cystic mass noted on the prior CT. 2 cm cyst in the left lobe, mildly increased in size from prior CT. There are no acute findings in the visualized upper abdomen. Musculoskeletal: No fracture or acute finding.  No bone lesion. Review of the MIP images confirms the above findings. IMPRESSION: 1. Normal appearance of the thoracic aorta.  No dissection. 2. Minor atherosclerotic plaque at the origin of the left subclavian artery. No hemodynamically significant stenosis the arch branch vessels. 3. No acute abnormalities within the chest. Electronically Signed   By: Lajean Manes M.D.   On: 12/01/2020 19:14   ECHOCARDIOGRAM COMPLETE  Result Date: 12/02/2020    ECHOCARDIOGRAM REPORT   Patient Name:   Meghan Bell [facility] Date of Exam: 12/02/2020 Medical Rec #:  [id number]        Height:       62.0 in Accession #:    [id number]       Weight:       145.0 lb Date of Birth:  03-14-1940         BSA:          1.667 m Patient Age:    59 years         BP:           112/64 mmHg Patient Gender: F                HR:           57 bpm. Exam Location:  [facility] Procedure: 2D Echo, Color Doppler, Cardiac Doppler and Intracardiac            Opacification  Agent Indications:     I63.9 Stroke  History:         Patient has no prior history of Echocardiogram examinations.                  CAD; Risk Factors:Hypertension and Sleep Apnea.  Sonographer:     Charmayne Sheer Referring Phys:  [id number] Athena Masse Diagnosing Phys: Donnelly Angelica  Sonographer Comments: Suboptimal parasternal window and suboptimal subcostal window. IMPRESSIONS  1. Left ventricular ejection fraction, by estimation, is 55 to 60%. The left ventricle has normal function. The left ventricle has no regional wall motion abnormalities. Left ventricular diastolic parameters were normal.  2. Right ventricular systolic function is normal. The right ventricular size is normal.  3. The mitral valve is normal in structure. Mild mitral valve regurgitation.  4. The aortic valve is normal in structure. Aortic valve regurgitation is not visualized. Mild aortic valve sclerosis is present, with no evidence of aortic valve stenosis. FINDINGS  Left Ventricle: Left ventricular ejection fraction, by estimation, is 55 to 60%. The left ventricle has normal function. The left ventricle has no regional wall motion abnormalities. Definity contrast agent was given IV to delineate the left ventricular  endocardial borders. The left ventricular internal cavity size was normal in size. There is no left ventricular hypertrophy. Left ventricular diastolic parameters were normal.  Right Ventricle: The right ventricular size is normal. No increase in right ventricular wall thickness. Right ventricular systolic function is normal. Left Atrium: Left atrial size was normal in size. Right Atrium: Right atrial size was normal in size. Pericardium: There is no evidence of pericardial effusion. Mitral Valve: The mitral valve is normal in structure. Mild mitral valve regurgitation. MV peak gradient, 4.4 mmHg. The mean mitral valve gradient is 2.0 mmHg. Tricuspid Valve: The tricuspid valve is normal in structure. Tricuspid valve regurgitation is not demonstrated. Aortic Valve: The aortic valve is normal in structure. Aortic valve regurgitation is not visualized. Mild aortic valve sclerosis is present, with no evidence of aortic valve stenosis. Aortic valve mean gradient measures 4.0 mmHg. Aortic valve peak gradient measures 6.9 mmHg. Aortic valve area, by VTI measures 2.01 cm. Pulmonic Valve: The pulmonic valve was not well visualized. Aorta: The aortic root is normal in size and structure. IAS/Shunts: The atrial septum is grossly normal.  LEFT VENTRICLE PLAX 2D LVIDd:         4.50 cm   Diastology LVIDs:         2.60 cm   LV e' medial:    7.29 cm/s LV PW:         1.10 cm   LV E/e' medial:  11.5 LV IVS:        0.70 cm   LV e' lateral:   7.51 cm/s LVOT diam:     1.90 cm   LV E/e' lateral: 11.1 LV SV:         65 LV SV Index:   39 LVOT Area:     2.84 cm  RIGHT VENTRICLE RV Basal diam:  2.70 cm RV S prime:     10.10 cm/s LEFT ATRIUM           Index        RIGHT ATRIUM           Index LA diam:      3.50 cm 2.10 cm/m   RA Area:     10.40 cm LA Vol (A2C): 34.3 ml 20.57 ml/m  RA Volume:   21.20  ml  12.71 ml/m  AORTIC VALVE                    PULMONIC VALVE AV Area (Vmax):    2.06 cm     PV Vmax:       0.74 m/s AV Area (Vmean):   2.01 cm     PV Vmean:      52.700 cm/s AV Area (VTI):     2.01 cm     PV VTI:        0.180 m AV Vmax:           131.00 cm/s  PV Peak grad:  2.2 mmHg AV Vmean:          91.800  cm/s  PV Mean grad:  1.0 mmHg AV VTI:            0.321 m AV Peak Grad:      6.9 mmHg AV Mean Grad:      4.0 mmHg LVOT Vmax:         95.30 cm/s LVOT Vmean:        65.000 cm/s LVOT VTI:          0.228 m LVOT/AV VTI ratio: 0.71  AORTA Ao Root diam: 3.00 cm MITRAL VALVE               TRICUSPID VALVE MV Area (PHT): 3.56 cm    TR Peak grad:   21.2 mmHg MV Area VTI:   2.27 cm    TR Vmax:        230.00 cm/s MV Peak grad:  4.4 mmHg MV Mean grad:  2.0 mmHg    SHUNTS MV Vmax:       1.05 m/s    Systemic VTI:  0.23 m MV Vmean:      60.4 cm/s   Systemic Diam: 1.90 cm MV Decel Time: 213 msec MV E velocity: 83.60 cm/s MV A velocity: 82.70 cm/s MV E/A ratio:  1.01 Donnelly Angelica Electronically signed by Donnelly Angelica Signature Date/Time: 12/02/2020/4:41:21 PM    Final    CT HEAD CODE STROKE WO CONTRAST  Result Date: 12/01/2020 CLINICAL DATA:  Code stroke. Left-sided numbness and chest pain, left-sided facial droop EXAM: CT HEAD WITHOUT CONTRAST TECHNIQUE: Contiguous axial images were obtained from the base of the skull through the vertex without intravenous contrast. COMPARISON:  None. FINDINGS: Brain: No evidence of acute infarction, hemorrhage, cerebral edema, mass, mass effect, or midline shift. Ventricles and sulci are normal for age. No extra-axial fluid collection. Vascular: No hyperdense vessel or unexpected calcification. Skull: Normal. Negative for fracture or focal lesion. Hyperostosis frontalis Sinuses/Orbits: No acute finding. Status post bilateral lens replacements. Other: The mastoid air cells are well aerated. ASPECTS [facility] Stroke Program Early CT Score) - Ganglionic level infarction (caudate, lentiform nuclei, internal capsule, insula, M1-M3 cortex): 7 - Supraganglionic infarction (M4-M6 cortex): 3 Total score (0-10 with 10 being normal): 10 IMPRESSION: 1. No acute intracranial process. 2. ASPECTS is 10 Code stroke imaging results were communicated on 12/01/2020 at 6:44 pm to provider Dr. Quentin Cornwall via telephone, who  verbally acknowledged these results. Electronically Signed   By: Merilyn Baba M.D.   On: 12/01/2020 18:44   CT ANGIO HEAD NECK W WO CM (CODE STROKE)  Result Date: 12/01/2020 CLINICAL DATA:  Facial numbness EXAM: CT ANGIOGRAPHY HEAD AND NECK TECHNIQUE: Multidetector CT imaging of the head and neck was performed using the standard protocol during bolus administration of intravenous contrast. Multiplanar CT image  reconstructions and MIPs were obtained to evaluate the vascular anatomy. Carotid stenosis measurements (when applicable) are obtained utilizing NASCET criteria, using the distal internal carotid diameter as the denominator. CONTRAST:  114mL OMNIPAQUE IOHEXOL 350 MG/ML SOLN COMPARISON:  Same day CT head. FINDINGS: CT HEAD FINDINGS Please see same day CT head without contrast. CTA NECK FINDINGS Aortic arch: Standard branching. Imaged portion shows no evidence of aneurysm or dissection. No significant stenosis of the major arch vessel origins. Right carotid system: No evidence of dissection, stenosis (50% or greater) or occlusion. Left carotid system: No evidence of dissection, stenosis (50% or greater) or occlusion. Vertebral arteries: Evaluation of the origin the right vertebral artery is limited by beam hardening artifact from adjacent venous bolus. Within this limitation, there is likely stenosis at the origin of the right vertebral artery, which is diminutive and becomes non-opacified in the mid V2 segment (series 7, images 139-150), with distal reconstitution in the V3 segment (series 7, image 199). The left vertebral artery is patent from the origin to the V4 segment. Skeleton: Multilevel degenerative changes in the cervical spine, with osseous fusion of C5 and C6. Reversal of the normal cervical lordosis. Other neck: Multiple hypoattenuating nodules in the thyroid, right greater than left lobe, the largest of which measures up to 1.0 cm. Upper chest: No focal pulmonary opacity or pleural effusion.  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 Suspect right cavernous ICA stent, which appears patent. Both internal carotid arteries are patent to the termini, without stenosis or other abnormality. A1 segments patent. Normal anterior communicating artery. Anterior cerebral arteries are patent to their distal aspects. No M1 stenosis or occlusion. Normal MCA bifurcations. Distal MCA branches perfused and symmetric. Posterior circulation: Narrowing of the distal right V3 and V4 segments (series 7, image 200, 214, 222). Left vertebral artery is dpatent to the vertebrobasilar junction without stenosis. Posterior inferior cerebral arteries patent bilaterally. Basilar patent to its distal aspect. Superior cerebral arteries patent bilaterally. PCAs well perfused to their distal aspects without stenosis. Venous sinuses: As permitted by contrast timing, patent. Anatomic variants: Bilateral posterior communicating arteries are not visualized. Review of the MIP images confirms the above findings IMPRESSION: 1. 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 In addition, there is multifocal narrowing of the distal right V3 and V4 segments, which remain patent. There is likely stenosis at the origin of the right vertebral artery; however, evaluation is limited by beam hardening artifact from the adjacent venous bolus. 2. No intracranial vessel occlusion. Electronically Signed   By: Merilyn Baba M.D.   On: 12/01/2020 19:22   (Echo, Carotid, EGD, Colonoscopy, ERCP)    Subjective: Patient seen and examined.  Denies any complaints.  She does have mild retrosternal pain and it is palpable.  No shortness of breath.  On room air.   Discharge Exam: Vitals:   12/02/20 0830 12/02/20 1007  BP: (!) 131/59 124/67  Pulse: (!) 56 (!) 56  Resp: 11 17  Temp:    SpO2: 94% 96%    Vitals:   12/02/20 0700 12/02/20 0730 12/02/20 0830 12/02/20 1007  BP: 112/64 (!) 142/83 (!) 131/59 124/67  Pulse: (!) 56 (!) 56 (!) 56 (!) 56  Resp: 12 13 11 17   Temp:      TempSrc:      SpO2: 95% 94% 94% 96%  Weight:  Height:        General: Pt is alert, awake, not in acute distress Cardiovascular: RRR, S1/S2 +, no rubs, no gallops, palpable midsternal mild tenderness. Respiratory: CTA bilaterally, no wheezing, no rhonchi Abdominal: Soft, NT, ND, bowel sounds + Extremities: no edema, no cyanosis No neurological deficit elicited.    The results of significant diagnostics from this hospitalization (including imaging, microbiology, ancillary and laboratory) are listed below for reference.     Microbiology: Recent Results (from the past 240 hour(s))  Resp Panel by RT-PCR (Flu A&B, Covid) Nasopharyngeal Swab     Status: None   Collection Time: 12/01/20  7:46 PM   Specimen: Nasopharyngeal Swab; Nasopharyngeal(NP) swabs in vial transport medium  Result Value Ref Range Status   SARS Coronavirus 2 by RT PCR NEGATIVE NEGATIVE Final    Comment: (NOTE) SARS-CoV-2 target nucleic acids are NOT DETECTED.  The SARS-CoV-2 RNA is generally detectable in upper respiratory specimens during the acute phase of infection. The lowest concentration of SARS-CoV-2 viral copies this assay can detect is 138 copies/mL. A negative result does not preclude SARS-Cov-2 infection and should not be used as the sole basis for treatment or other patient management decisions. A negative result may occur with  improper specimen collection/handling, submission of specimen other than nasopharyngeal swab, presence of viral mutation(s) within the areas targeted by this assay, and inadequate number of viral copies(<138 copies/mL). A negative result must be combined with clinical observations, patient history, and epidemiological information. The expected result is Negative.  Fact Sheet for Patients:   EntrepreneurPulse.com.au  Fact Sheet for Healthcare Providers:  IncredibleEmployment.be  This test is no t yet approved or cleared by the [place] FDA and  has been authorized for detection and/or diagnosis of SARS-CoV-2 by FDA under an Emergency Use Authorization (EUA). This EUA will remain  in effect (meaning this test can be used) for the duration of the COVID-19 declaration under Section 564(b)(1) of the Act, 21 U.S.C.section 360bbb-3(b)(1), unless the authorization is terminated  or revoked sooner.       Influenza A by PCR NEGATIVE NEGATIVE Final   Influenza B by PCR NEGATIVE NEGATIVE Final    Comment: (NOTE) The Xpert Xpress SARS-CoV-2/FLU/RSV plus assay is intended as an aid in the diagnosis of influenza from Nasopharyngeal swab specimens and should not be used as a sole basis for treatment. Nasal washings and aspirates are unacceptable for Xpert Xpress SARS-CoV-2/FLU/RSV testing.  Fact Sheet for Patients: EntrepreneurPulse.com.au  Fact Sheet for Healthcare Providers: IncredibleEmployment.be  This test is not yet approved or cleared by the [place] FDA and has been authorized for detection and/or diagnosis of SARS-CoV-2 by FDA under an Emergency Use Authorization (EUA). This EUA will remain in effect (meaning this test can be used) for the duration of the COVID-19 declaration under Section 564(b)(1) of the Act, 21 U.S.C. section 360bbb-3(b)(1), unless the authorization is terminated or revoked.  Performed at [facility] [address] [place], [place] [address]      Labs: BNP (last 3 results) No results for input(s): BNP in the last 8760 hours. Basic Metabolic Panel: Recent Labs  Lab 12/01/20 1946  NA 136  K 3.8  CL 105  CO2 21*  GLUCOSE 96  BUN 9  CREATININE 0.65  CALCIUM 8.6*   Liver Function Tests: Recent Labs  Lab 12/01/20 1946  AST 26  ALT 24   ALKPHOS 64  BILITOT 0.8  PROT 6.7  ALBUMIN 3.8   No results for input(s): LIPASE,  AMYLASE in the last 168 hours. No results for input(s): AMMONIA in the last 168 hours. CBC: Recent Labs  Lab 12/01/20 1946  WBC 8.1  NEUTROABS 4.7  HGB 12.2  HCT 35.9*  MCV 99.4  PLT 312   Cardiac Enzymes: No results for input(s): CKTOTAL, CKMB, CKMBINDEX, TROPONINI in the last 168 hours. BNP: Invalid input(s): POCBNP CBG: Recent Labs  Lab 12/01/20 1829  GLUCAP 123*   D-Dimer No results for input(s): DDIMER in the last 72 hours. Hgb A1c Recent Labs    12/02/20 0711  HGBA1C 6.4*   Lipid Profile Recent Labs    12/02/20 0711  CHOL 195  HDL 64  LDLCALC 107*  TRIG 119  CHOLHDL 3.0   Thyroid function studies No results for input(s): TSH, T4TOTAL, T3FREE, THYROIDAB in the last 72 hours.  Invalid input(s): FREET3 Anemia work up No results for input(s): VITAMINB12, FOLATE, FERRITIN, TIBC, IRON, RETICCTPCT in the last 72 hours. Urinalysis    Component Value Date/Time   COLORURINE Colorless 05/16/2014 1514   APPEARANCEUR Clear 05/16/2014 1514   LABSPEC 1.002 05/16/2014 1514   PHURINE 7.0 05/16/2014 1514   GLUCOSEU Negative 05/16/2014 1514   HGBUR Negative 05/16/2014 1514   BILIRUBINUR Negative 05/16/2014 1514   KETONESUR Negative 05/16/2014 1514   PROTEINUR Negative 05/16/2014 1514   NITRITE Negative 05/16/2014 1514   LEUKOCYTESUR Negative 05/16/2014 1514   Sepsis Labs Invalid input(s): PROCALCITONIN,  WBC,  LACTICIDVEN Microbiology Recent Results (from the past 240 hour(s))  Resp Panel by RT-PCR (Flu A&B, Covid) Nasopharyngeal Swab     Status: None   Collection Time: 12/01/20  7:46 PM   Specimen: Nasopharyngeal Swab; Nasopharyngeal(NP) swabs in vial transport medium  Result Value Ref Range Status   SARS Coronavirus 2 by RT PCR NEGATIVE NEGATIVE Final    Comment: (NOTE) SARS-CoV-2 target nucleic acids are NOT DETECTED.  The SARS-CoV-2 RNA is generally detectable in  upper respiratory specimens during the acute phase of infection. The lowest concentration of SARS-CoV-2 viral copies this assay can detect is 138 copies/mL. A negative result does not preclude SARS-Cov-2 infection and should not be used as the sole basis for treatment or other patient management decisions. A negative result may occur with  improper specimen collection/handling, submission of specimen other than nasopharyngeal swab, presence of viral mutation(s) within the areas targeted by this assay, and inadequate number of viral copies(<138 copies/mL). A negative result must be combined with clinical observations, patient history, and epidemiological information. The expected result is Negative.  Fact Sheet for Patients:  EntrepreneurPulse.com.au  Fact Sheet for Healthcare Providers:  IncredibleEmployment.be  This test is no t yet approved or cleared by the [place] FDA and  has been authorized for detection and/or diagnosis of SARS-CoV-2 by FDA under an Emergency Use Authorization (EUA). This EUA will remain  in effect (meaning this test can be used) for the duration of the COVID-19 declaration under Section 564(b)(1) of the Act, 21 U.S.C.section 360bbb-3(b)(1), unless the authorization is terminated  or revoked sooner.       Influenza A by PCR NEGATIVE NEGATIVE Final   Influenza B by PCR NEGATIVE NEGATIVE Final    Comment: (NOTE) The Xpert Xpress SARS-CoV-2/FLU/RSV plus assay is intended as an aid in the diagnosis of influenza from Nasopharyngeal swab specimens and should not be used as a sole basis for treatment. Nasal washings and aspirates are unacceptable for Xpert Xpress SARS-CoV-2/FLU/RSV testing.  Fact Sheet for Patients: EntrepreneurPulse.com.au  Fact Sheet for Healthcare Providers: IncredibleEmployment.be  This  test is not yet approved or cleared by the [place] and has been  authorized for detection and/or diagnosis of SARS-CoV-2 by FDA under an Emergency Use Authorization (EUA). This EUA will remain in effect (meaning this test can be used) for the duration of the COVID-19 declaration under Section 564(b)(1) of the Act, 21 U.S.C. section 360bbb-3(b)(1), unless the authorization is terminated or revoked.  Performed at [facility], [address] [place], [place] [address]      Time coordinating discharge:  35 minutes  SIGNED:   Barb Merino, MD  [facility] 12/02/2020, 4:48 PM

## 2020-12-02 NOTE — Progress Notes (Addendum)
MRI brain: 1. No acute intracranial abnormality. 2. Mild volume loss and chronic small vessel ischemic changes.  Echocardiogram report pending.   Addendum: -TTE reveals normal LVEF and normal LV function with no regional wall motion abnormalities. No valvular vegetation or mural thrombus mentioned in the report.  -Stroke work up complete.  -ASA was switched to Plavix. Recommend adding back ASA for DAPT.  -Outpatient Neurology follow up should be scheduled prior to discharge.  Electronically signed: Dr. Kerney Elbe

## 2020-12-02 NOTE — Evaluation (Signed)
Physical Therapy Evaluation Patient Details Name: Meghan Bell MRN: [id number] DOB: 27-Aug-1940 Today's Date: 12/02/2020  History of Present Illness  Pt is an 80 y.o. female with medical history significant for brain aneurysm s/p coiling, CAD, anxiety and depression, HTN, sleep apnea on CPAP who presents from the [facility] as a code stroke with left-sided numbness and left facial droop. Per neurology note, no acute intracranial abnormality and mild volume loss and chronic small vessel ischemic changes.   Clinical Impression  Pt was pleasant and motivated to participate during the session and put forth good effort throughout.  Pt required no physical assistance during the session and presented with no asymmetrical deficits in strength, sensation, or coordination.  Pt did present with mild deficits in stability and general gait quality per below during ambulation that is not baseline for patient.  Pt will benefit from OPPT upon discharge to safely address deficits listed in patient problem list for decreased caregiver assistance and eventual return to PLOF.         Recommendations for follow up therapy are one component of a multi-disciplinary discharge planning process, led by the attending physician.  Recommendations may be updated based on patient status, additional functional criteria and insurance authorization.  Follow Up Recommendations Outpatient PT    Assistance Recommended at Discharge Intermittent Supervision/Assistance  Functional Status Assessment Patient has had a recent decline in their functional status and demonstrates the ability to make significant improvements in function in a reasonable and predictable amount of time.  Equipment Recommendations  None recommended by PT    Recommendations for Other Services       Precautions / Restrictions Precautions Precautions: Fall Restrictions Weight Bearing Restrictions: No      Mobility  Bed Mobility Overal bed  mobility: Modified Independent             General bed mobility comments: Min extra time and effort only    Transfers Overall transfer level: Needs assistance Equipment used: None Transfers: Sit to/from Stand Sit to Stand: Supervision           General transfer comment: Good eccentric and concentric control and stability    Ambulation/Gait Ambulation/Gait assistance: Supervision Gait Distance (Feet): 125 Feet Assistive device: None Gait Pattern/deviations: Step-through pattern;Decreased step length - right;Decreased step length - left;Drifts right/left Gait velocity: decreased   General Gait Details: Slow cadence with mild drifting left/right but no overt LOB; both pt and granddaughter report slower candence with decreased stability compared to baseline  Stairs            Wheelchair Mobility    Modified Rankin (Stroke Patients Only)       Balance Overall balance assessment: Needs assistance   Sitting balance-Leahy Scale: Good     Standing balance support: During functional activity;No upper extremity supported Standing balance-Leahy Scale: Fair                               Pertinent Vitals/Pain Pain Assessment: 0-10 Pain Score: 5  Pain Location: sternal area Pain Descriptors / Indicators: Aching Pain Intervention(s): Premedicated before session;Monitored during session    [place] expects to be discharged to:: Private residence Living Arrangements: Alone Available Help at Discharge: Family;Available 24 hours/day Type of Home: House Home Access: Stairs to enter Entrance Stairs-Rails: Right;Left;Can reach both Entrance Stairs-Number of Steps: 3   Home Layout: One level Home Equipment: Conservation officer, nature (2 wheels);Cane - quad;Shower seat - built in;Grab  bars - tub/shower      Prior Function Prior Level of Function : Independent/Modified Independent             Mobility Comments: Ind amb community distances  without an AD, Ind with ADLs, drives, one fall in the last 6 months secondary to LOB       Hand Dominance   Dominant Hand: Right    Extremity/Trunk Assessment   Upper Extremity Assessment Upper Extremity Assessment: Generalized weakness;RUE deficits/detail;LUE deficits/detail RUE Deficits / Details: RUE strength grossly 4-/5 and equal L/R; min deficits in fine motor control but equal L/R RUE Sensation: WNL RUE Coordination: decreased fine motor LUE Deficits / Details: LUE strength grossly 4-/5 and equal L/R; min deficits in fine motor control but equal L/R LUE Sensation: WNL LUE Coordination: decreased fine motor    Lower Extremity Assessment Lower Extremity Assessment: Generalized weakness;RLE deficits/detail;LLE deficits/detail RLE Deficits / Details: RLE strength grossly 4/5 and equal L/R RLE Sensation: WNL RLE Coordination: WNL LLE Deficits / Details: LLE strength grossly 4/5 and equal L/R LLE Sensation: WNL LLE Coordination: WNL       Communication   Communication: No difficulties  Cognition Arousal/Alertness: Awake/alert Behavior During Therapy: WFL for tasks assessed/performed Overall Cognitive Status: Within Functional Limits for tasks assessed                                          General Comments      Exercises     Assessment/Plan    PT Assessment Patient needs continued PT services  PT Problem List Decreased strength;Decreased activity tolerance;Decreased balance;Decreased mobility       PT Treatment Interventions DME instruction;Gait training;Stair training;Functional mobility training;Therapeutic activities;Therapeutic exercise;Balance training;Patient/family education    PT Goals (Current goals can be found in the Care Plan section)  Acute Rehab PT Goals Patient Stated Goal: To return home, improved balance PT Goal Formulation: With patient Time For Goal Achievement: 12/15/20 Potential to Achieve Goals: Good    Frequency  Min 2X/week   Barriers to discharge        Co-evaluation               AM-PAC PT "6 Clicks" Mobility  Outcome Measure Help needed turning from your back to your side while in a flat bed without using bedrails?: None Help needed moving from lying on your back to sitting on the side of a flat bed without using bedrails?: None Help needed moving to and from a bed to a chair (including a wheelchair)?: A Little Help needed standing up from a chair using your arms (e.g., wheelchair or bedside chair)?: A Little Help needed to walk in hospital room?: A Little Help needed climbing 3-5 steps with a railing? : A Little 6 Click Score: 20    End of Session Equipment Utilized During Treatment: Gait belt Activity Tolerance: Patient tolerated treatment well Patient left: in bed;with call bell/phone within reach;with family/visitor present;Other (comment) (Bilateral rails up as pt found in ED) Nurse Communication: Mobility status PT Visit Diagnosis: Difficulty in walking, not elsewhere classified (R26.2);Unsteadiness on feet (R26.81);Muscle weakness (generalized) (M62.81)    Time: 1030-1102 PT Time Calculation (min) (ACUTE ONLY): 32 min   Charges:   PT Evaluation $PT Eval Moderate Complexity: 1 Mod         D. Scott Sylvio Weatherall PT, DPT 12/02/20, 11:24 AM

## 2020-12-02 NOTE — Evaluation (Signed)
Speech Language Pathology Evaluation Patient Details Name: Meghan Bell MRN: [id number] DOB: November 26, 1940 Today's Date: 12/02/2020 Time: 1093-2355 SLP Time Calculation (min) (ACUTE ONLY): 35 min  Problem List:  Patient Active Problem List   Diagnosis Date Noted   TIA (transient ischemic attack) 12/02/2020   CVA (cerebral vascular accident) ([place]) 12/01/2020   Chest pain 12/01/2020   Controlled type 2 diabetes mellitus with complication, without long-term current use of insulin ([place]) 12/05/2016   H/O cerebral aneurysm repair 11/28/2016   Anxiety state 09/21/2016   OSA on CPAP 11/23/2015   Coronary artery disease involving native coronary artery of native heart with angina pectoris ([place]) 05/23/2014   Essential hypertension 07/07/2013   Past Medical History:  Past Medical History:  Diagnosis Date   Arthritis    B12 deficiency    Brain aneurysm    Cancer ([place])    SKIN-non-melanoma   Coronary artery disease    Depression    Dyspnea    DOE   Dysrhythmia    GERD (gastroesophageal reflux disease)    Hammer toe of right foot    second toe   Heart murmur    Hypertension    Memory loss    Osteoporosis    Sleep apnea    Past Surgical History:  Past Surgical History:  Procedure Laterality Date   ABDOMINAL HYSTERECTOMY  1972   APPENDECTOMY     BLADDER SURGERY  2011   BRAIN SURGERY     ANEURTSM WITH COIL   BREAST SURGERY Left 2002   LUMPECTOMY   CATARACT EXTRACTION W/PHACO Right 11/21/2017   Procedure: CATARACT EXTRACTION PHACO AND INTRAOCULAR LENS PLACEMENT ([place]);  Surgeon: Birder Robson, MD;  Location: [facility] ORS;  Service: Ophthalmology;  Laterality: Right;  [place] 00:41 CDE 5.29 Fluid pack lot # [id number]   CATARACT EXTRACTION W/PHACO Left 12/19/2017   Procedure: CATARACT EXTRACTION PHACO AND INTRAOCULAR LENS PLACEMENT ([facility]);  Surgeon: Birder Robson, MD;  Location: [facility] ORS;  Service: Ophthalmology;  Laterality: Left;  [place] 00:33 CDE 3.94 Fluid pack lot # [id number]    CHOLECYSTECTOMY     FOOT SURGERY     HEMORROIDECTOMY     RCR  2006   SALPINGOOPHORECTOMY     TONSILLECTOMY     HPI:  Per Physician H&P: "Pt is 80 y.o. female with medical history significant for Brain aneurysm s/p coiling, CAD, anxiety and depression, HTN, sleep apnea on CPAP who presents from the [facility] as a code stroke with left-sided numbness and left facial droop that started at 1600.  Patient also complaining of substernal chest pain of moderate intensity, nonradiating.  She denies nausea, vomiting, diaphoresis or lightheadedness.  Has no cough, fever or chills or shortness of breath." MRI Brain 12/01/20: No acute intracranial abnormality.  2. Mild volume loss and chronic small vessel ischemic changes.   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 Pt/family endorse baseline memory challenges, that persist today. However informal testing and pt interview revealing mild impairment in executive functioning, thought organization, and expression in complex conversation. Verbal expression c/b hesitations at conversational level, with pt benefiting from extended time and min semantic prompts. Cognitive processing aided by rephrasing and min repetition. Relative strengths noted for safety processing, sustained attention, and auditory comprehension. PTA, pt required set up for medication management and family assisted with money management. Education shared regarding plan to follow up for continued cognitive assessment and strategies to aid  memory/processing while in house. Pt/family reported understanding. Given identified deficit and pt/family report of change from baseline, SLP will follow for cognitive communication intervention.     SLP Assessment  SLP Recommendation/Assessment: Patient needs continued [facility] SLP Visit Diagnosis: Cognitive  communication deficit (R41.841)    Recommendations for follow up therapy are one component of a multi-disciplinary discharge planning process, led by the attending physician.  Recommendations may be updated based on patient status, additional functional criteria and insurance authorization.    Follow Up Recommendations  Home health SLP    Frequency and Duration min 1 x/week  2 weeks      SLP Evaluation Cognition  Overall Cognitive Status: Impaired/Different from baseline Arousal/Alertness: Awake/alert Orientation Level: Oriented to person;Oriented to place;Oriented to time;Oriented to situation Attention: Sustained Sustained Attention: Appears intact Memory: Impaired Memory Impairment: Decreased short term memory (baseline) Decreased Short Term Memory: Verbal complex Awareness: Appears intact Problem Solving:  (will be further assessed) Executive Function: Organizing;Decision Making Organizing: Impaired (generative naming +8 (below [facility])) Organizing Impairment: Verbal complex Decision Making: Impaired Decision Making Impairment: Verbal complex (for complex verbal reasonign) Safety/Judgment: Appears intact       Comprehension  Auditory Comprehension Overall Auditory Comprehension: Appears within functional limits for tasks assessed Yes/No Questions: Within Functional Limits Commands: Within Functional Limits Conversation: Complex Interfering Components: Working Field seismologist: Dispensing optician Discrimination: Not tested Reading Comprehension Reading Status: Not tested    Expression Expression Primary Mode of Expression: Verbal Verbal Expression Overall Verbal Expression: Impaired Initiation: No impairment Automatic Speech:  (WFL) Level of Generative/Spontaneous Verbalization: Conversation (mild hesitations and anomic pauses for complex conversation. Famliy repor that is different from baseline.) Repetition:  No impairment Naming: No impairment Pragmatics: No impairment Effective Techniques: Semantic cues Written Expression Dominant Hand:  (not assessed) Written Expression: Not tested   Oral / Motor  Oral Motor/Sensory Function Overall Oral Motor/Sensory Function: Within functional limits Motor Speech Overall Motor Speech: Appears within functional limits for tasks assessed Respiration: Within functional limits Phonation: Normal Resonance: Within functional limits Articulation: Within functional limitis Intelligibility: Intelligible Motor Planning: Witnin functional limits Motor Speech Errors: Not applicable   GO          Meghan Lallie Strahm MS, CCC-SLP           Meghan Bell 12/02/2020, 10:43 AM

## 2020-12-02 NOTE — Progress Notes (Signed)
*  PRELIMINARY RESULTS* Echocardiogram 2D Echocardiogram has been performed.  Meghan Bell 12/02/2020, 10:08 AM

## 2020-12-02 NOTE — Care Management Obs Status (Signed)
[place] NOTIFICATION   Patient Details  Name: Meghan Bell MRN: [id number] Date of Birth: 05-11-1940   Medicare Observation Status Notification Given:       Laurena Slimmer, RN 12/02/2020, 6:38 PM

## 2021-01-07 ENCOUNTER — Ambulatory Visit: Payer: Medicare PPO

## 2021-01-11 ENCOUNTER — Ambulatory Visit: Payer: Medicare PPO

## 2021-01-14 ENCOUNTER — Ambulatory Visit: Payer: Medicare PPO

## 2021-01-21 ENCOUNTER — Ambulatory Visit: Payer: Medicare PPO | Admitting: Physical Therapy

## 2021-02-03 ENCOUNTER — Ambulatory Visit: Payer: Medicare PPO

## 2021-02-08 ENCOUNTER — Ambulatory Visit: Payer: Medicare PPO

## 2021-02-10 ENCOUNTER — Ambulatory Visit: Payer: Medicare PPO

## 2021-02-15 ENCOUNTER — Ambulatory Visit: Payer: Medicare PPO

## 2021-02-17 ENCOUNTER — Ambulatory Visit: Payer: Medicare PPO

## 2021-02-22 ENCOUNTER — Ambulatory Visit: Payer: Medicare PPO

## 2021-02-24 ENCOUNTER — Ambulatory Visit: Payer: Medicare PPO

## 2021-03-01 ENCOUNTER — Ambulatory Visit: Payer: Medicare PPO | Admitting: Physical Therapy

## 2021-03-03 ENCOUNTER — Ambulatory Visit: Payer: Medicare PPO

## 2021-03-08 ENCOUNTER — Ambulatory Visit: Payer: Medicare PPO

## 2021-03-10 ENCOUNTER — Ambulatory Visit: Payer: Medicare PPO

## 2021-03-15 ENCOUNTER — Ambulatory Visit: Payer: Medicare PPO

## 2021-03-17 ENCOUNTER — Ambulatory Visit: Payer: Medicare PPO

## 2021-03-22 ENCOUNTER — Ambulatory Visit: Payer: Medicare PPO

## 2021-03-24 ENCOUNTER — Ambulatory Visit: Payer: Medicare PPO

## 2021-03-29 ENCOUNTER — Ambulatory Visit: Payer: Medicare PPO

## 2021-03-31 ENCOUNTER — Ambulatory Visit: Payer: Medicare PPO

## 2022-03-26 ENCOUNTER — Emergency Department
Admission: EM | Admit: 2022-03-26 | Discharge: 2022-03-26 | Disposition: A | Discharge status: Home / Self Care | Payer: Medicare PPO | Attending: Emergency Medicine | Admitting: Emergency Medicine

## 2022-03-26 ENCOUNTER — Other Ambulatory Visit: Payer: Self-pay

## 2022-03-26 ENCOUNTER — Emergency Department: Payer: Medicare PPO

## 2022-03-26 DIAGNOSIS — I10 Essential (primary) hypertension: Secondary | ICD-10-CM | POA: Diagnosis not present

## 2022-03-26 DIAGNOSIS — W19XXXA Unspecified fall, initial encounter: Secondary | ICD-10-CM | POA: Diagnosis not present

## 2022-03-26 DIAGNOSIS — R55 Syncope and collapse: Secondary | ICD-10-CM | POA: Diagnosis not present

## 2022-03-26 DIAGNOSIS — Z85828 Personal history of other malignant neoplasm of skin: Secondary | ICD-10-CM | POA: Diagnosis not present

## 2022-03-26 DIAGNOSIS — R42 Dizziness and giddiness: Secondary | ICD-10-CM

## 2022-03-26 LAB — CBC
HCT: 38.3 % (ref 36.0–46.0)
Hemoglobin: 12.4 g/dL (ref 12.0–15.0)
MCH: 32.3 pg (ref 26.0–34.0)
MCHC: 32.4 g/dL (ref 30.0–36.0)
MCV: 99.7 fL (ref 80.0–100.0)
Platelets: 404 10*3/uL — ABNORMAL HIGH (ref 150–400)
RBC: 3.84 MIL/uL — ABNORMAL LOW (ref 3.87–5.11)
RDW: 14.8 % (ref 11.5–15.5)
WBC: 7.4 10*3/uL (ref 4.0–10.5)
nRBC: 0 % (ref 0.0–0.2)

## 2022-03-26 LAB — TROPONIN I (HIGH SENSITIVITY)
Troponin I (High Sensitivity): 4 ng/L (ref <18)
Troponin I (High Sensitivity): 4 ng/L (ref <18)

## 2022-03-26 LAB — MAGNESIUM: Magnesium: 1.9 mg/dL (ref 1.7–2.4)

## 2022-03-26 LAB — BASIC METABOLIC PANEL
Anion gap: 10 (ref 5–15)
BUN: 7 mg/dL — ABNORMAL LOW (ref 8–23)
CO2: 23 mmol/L (ref 22–32)
Calcium: 9 mg/dL (ref 8.9–10.3)
Chloride: 106 mmol/L (ref 98–111)
Creatinine, Ser: 0.64 mg/dL (ref 0.44–1.00)
GFR, Estimated: >60 mL/min (ref >60)
Glucose, Bld: 127 mg/dL — ABNORMAL HIGH (ref 70–99)
Potassium: 3.8 mmol/L (ref 3.5–5.1)
Sodium: 139 mmol/L (ref 135–145)

## 2022-03-26 MED ORDER — SODIUM CHLORIDE 0.9 % IV BOLUS
1000.0000 mL | Freq: Once | INTRAVENOUS | Status: AC
  Administered 2022-03-26: 1000 mL via INTRAVENOUS

## 2022-03-26 NOTE — ED Triage Notes (Signed)
Pt to ED from home with family. Pt fell at home. She landed on her knees but hit her head in the fall and is on plavix. Pt is CAOxx4 and in no acute distress. Family at bedside states "she is at her baseline". Does live by herself. Pt denies LOC. Pt got up and got dizzy and fell.

## 2022-03-26 NOTE — ED Provider Notes (Signed)
[facility] Provider Note    Event Date/Time   First MD Initiated Contact with Patient 03/26/22 [phone]     (approximate)   History   Fall   HPI  Meghan Bell is a 82 y.o. female   Past medical history of knee pain due to arthritis, skin cancer, brain aneurysm, dysrhythmia, hypertension, osteoporosis who presents to the emergency department with a fall.  She was in her regular state of health, skipped breakfast this morning, was urinating in the bathroom and upon standing she felt lightheaded had presyncopal episode did not lose consciousness fell forward onto her right knee, no significant downtime.  No head strike or loss of consciousness.  She called her granddaughter afterward.  She denied chest pain, palpitations, shortness of breath, any preceding illnesses and has been in her regular state of health recently.  She was planning to get lunch with her granddaughter and had minimal breakfast beforehand.  Currently she has no complaints and feels well.  Independent Historian contributed to assessment above: Granddaughter at bedside       Physical Exam   Triage Vital Signs: ED Triage Vitals [03/26/22 1457]  Enc Vitals Group     BP 127/80     Pulse Rate 96     Resp 16     Temp 97.9 F (36.6 C)     Temp Source Oral     SpO2 98 %     Weight 150 lb (68 kg)     Height '5\' 2"'$  (1.575 m)     Head Circumference      Peak Flow      Pain Score 5     Pain Loc      Pain Edu?      Excl. in [place]?     Most recent vital signs: Vitals:   03/26/22 1800 03/26/22 1900  BP: (!) 108/58 126/60  Pulse: 64 64  Resp:    Temp:    SpO2: 97% 96%    General: Awake, no distress.  CV:  She has dry mucous membranes and some poor skin turgor, appears slightly dehydrated Resp:  Normal effort.  Abd:  No distention.  Other:  Awake alert oriented comfortable with normal vital signs.  No motor or sensory deficits to all extremities, no facial asymmetry or  dysarthria.  Mentation is normal.  She does have some tenderness to the right knee which she states is chronic but she has full active range of motion and neurovascular intact.  No other acute traumatic injuries noted.   ED Results / Procedures / Treatments   Labs (all labs ordered are listed, but only abnormal results are displayed) Labs Reviewed  CBC - Abnormal; Notable for the following components:      Result Value   RBC 3.84 (*)    Platelets 404 (*)    All other components within normal limits  BASIC METABOLIC PANEL - Abnormal; Notable for the following components:   Glucose, Bld 127 (*)    BUN 7 (*)    All other components within normal limits  MAGNESIUM  TROPONIN I (HIGH SENSITIVITY)  TROPONIN I (HIGH SENSITIVITY)     I ordered and reviewed the above labs they are notable for H&H is 12/38 which is baseline.  EKG  ED ECG REPORT I, Lucillie Garfinkel, the attending physician, personally viewed and interpreted this ECG.   Date: 03/26/2022  EKG Time: 1500  Rate: 98  Rhythm: sinus  Axis: nl  Intervals:none  ST&T Change: No acute ischemic changes    RADIOLOGY I independently reviewed and interpreted CT of the head see no obvious bleeding or midline shift   PROCEDURES:  Critical Care performed: No  Procedures   MEDICATIONS ORDERED IN ED: Medications  sodium chloride 0.9 % bolus 1,000 mL (0 mLs Intravenous Stopped 03/26/22 1831)    IMPRESSION / MDM / ASSESSMENT AND PLAN / ED COURSE  I reviewed the triage vital signs and the nursing notes.                                Patient's presentation is most consistent with acute presentation with potential threat to life or bodily function.  Differential diagnosis includes, but is not limited to, orthostatic lightheadedness, dehydration, electrolyte disturbance, hypoglycemia, vasovagal syncope, dysrhythmia, ACS, PE, CVA or acute traumatic injuries including intracranial bleeding, knee fracture dislocation   The  patient is on the cardiac monitor to evaluate for evidence of arrhythmia and/or significant heart rate changes.  MDM: This is a patient with transient lightheadedness upon standing led to a presyncopal event where she struck her knee.  Traumatic survey shows no signs of acute traumatic injuries but will obtain a CT of the head as well as a right knee x-ray fortunately both are without acute traumatic injury.  She looks well.  Upon standing she does have some mild lightheadedness but no other focal neurologic deficits to suggest CVA.  She does look slightly dehydrated did not eat breakfast this morning.  I will give her some fluids, check basic labs including electrolytes, troponins, and then trial ambulation again.  If the above workup is negative and she is able to walk without symptoms I think she can be discharged at this time and both she and her granddaughter agree to this plan.  I considered hospitalization for admission or observation workup as above has been unremarkable and her symptoms are not markedly improved, ambulating without symptoms in the emergency department room when I rechecked her after some fluids, I think that outpatient follow-up and monitoring most appropriate at this time the patient and granddaughter are in agreement.  Return precautions were given.        FINAL CLINICAL IMPRESSION(S) / ED DIAGNOSES   Final diagnoses:  Fall, initial encounter  Postural dizziness with near syncope     Rx / DC Orders   ED Discharge Orders     None        Note:  This document was prepared using Dragon voice recognition software and may include unintentional dictation errors.    Lucillie Garfinkel, MD 03/26/22 [phone]

## 2022-03-26 NOTE — Discharge Instructions (Signed)
Thank you for choosing us for your health care today!  Please see your primary doctor this week for a follow up appointment.   Sometimes, in the early stages of certain disease courses it is difficult to detect in the emergency department evaluation -- so, it is important that you continue to monitor your symptoms and call your doctor right away or return to the emergency department if you develop any new or worsening symptoms.  Please go to the following website to schedule new (and existing) patient appointments:   [email or web address]  If you do not have a primary doctor try calling the following clinics to establish care:  If you have insurance:  [facility] [phone] [address] [place] [address]   [facility]  [phone] [address] [place] [address] [place] [address]   If you do not have insurance:  [facility]  [phone] [address] [place] [address]   The following is another list of primary care offices in the area who are accepting new patients at this time.  Please reach out to one of them directly and let them know you would like to schedule an appointment to follow up on an Emergency Department visit, and/or to establish a new primary care provider (PCP).  There are likely other primary care clinics in the are who are accepting new patients, but this is an excellent place to start:  [facility] Lead physician: Dr Angela Bacigalupo [address] [place], [place] [address] [phone]  [facility] Lead Physician: Dr Krichna Sowles [address], [place], [place] [address] [phone]  [facility]  Lead Physician: Dr Megan Johnson [address], [place], [place] [address] [phone]  [facility] [place] [facility] Lead Physician: Dr Alex Karamalegos [address], [place], [place] [address] [phone]  [facility] [place] Lead Physician: Dr Laura Berglund [address], [place], [place] [address] [phone]   It was my pleasure to care for you today.   Keenan Dimitrov S. Izik Bingman, MD  

## 2022-12-02 ENCOUNTER — Ambulatory Visit: Payer: Medicare PPO | Admitting: Urology

## 2022-12-22 ENCOUNTER — Ambulatory Visit: Payer: Medicare PPO | Admitting: Urology

## 2022-12-22 ENCOUNTER — Encounter: Payer: Self-pay | Admitting: Urology

## 2022-12-22 VITALS — BP 106/64 | HR 83 | Ht 62.0 in | Wt 150.0 lb

## 2022-12-22 DIAGNOSIS — R310 Gross hematuria: Secondary | ICD-10-CM

## 2022-12-22 NOTE — Progress Notes (Signed)
I, Meghan Bell, acting as a scribe for Meghan Altes, MD., have documented all relevant documentation on the behalf of Meghan Altes, MD, as directed by Meghan Altes, MD while in the presence of Meghan Altes, MD.  12/22/2022 3:32 PM   Meghan Bell 1940-12-08 [id number]  Referring provider: Lauro Regulus, MD [address] [facility] [place],  [place] [address]  Chief Complaint  Patient presents with   Hematuria    HPI: Meghan Bell is a 82 y.o. female referred for evaluation of gross hematuria.  Last month she had onset of total gross painless hematuria, which lasted approximately2 days. She is on Plavix and contacted her PCP who recommended she hold the medication. She held the medication for 7 days, and when she restarted, had recurrent gross hematuria within 2 days.  She had no bothersome lower urinary tract symptoms.  Denied dysuria, flank/abdominal/pelvic pain.  Her urine was described as red in color without clots.  No previous history of urologic problems.    PMH: Past Medical History:  Diagnosis Date   Arthritis    B12 deficiency    Brain aneurysm    Cancer (HCC)    SKIN-non-melanoma   Coronary artery disease    Depression    Dyspnea    DOE   Dysrhythmia    GERD (gastroesophageal reflux disease)    Hammer toe of right foot    second toe   Heart murmur    Hypertension    Memory loss    Osteoporosis    Sleep apnea     Surgical History: Past Surgical History:  Procedure Laterality Date   ABDOMINAL HYSTERECTOMY  1972   APPENDECTOMY     BLADDER SURGERY  2011   BRAIN SURGERY     ANEURTSM WITH COIL   BREAST SURGERY Left 2002   LUMPECTOMY   CATARACT EXTRACTION W/PHACO Right 11/21/2017   Procedure: CATARACT EXTRACTION PHACO AND INTRAOCULAR LENS PLACEMENT (IOC);  Surgeon: Galen Manila, MD;  Location: [facility] ORS;  Service: Ophthalmology;  Laterality: Right;  [place] 00:41 CDE 5.29 Fluid pack lot # [id number]    CATARACT EXTRACTION W/PHACO Left 12/19/2017   Procedure: CATARACT EXTRACTION PHACO AND INTRAOCULAR LENS PLACEMENT (IOC);  Surgeon: Galen Manila, MD;  Location: [facility] ORS;  Service: Ophthalmology;  Laterality: Left;  [place] 00:33 CDE 3.94 Fluid pack lot # [id number]   CHOLECYSTECTOMY     FOOT SURGERY     HEMORROIDECTOMY     RCR  2006   SALPINGOOPHORECTOMY     TONSILLECTOMY      Home Medications:  Allergies as of 12/22/2022       Reactions   Amoxicillin Other (See Comments)   Other reaction(s): Other (See Comments) Other Reaction: GI Upset Other Reaction: GI Upset   Erythromycin Shortness Of Breath, Rash   Influenza Vaccines Rash, Shortness Of Breath   Bacitracin Dermatitis   Elemental Sulfur    Other    Other reaction(s): Other (See Comments), Other (See Comments) Crestor, Lipitor, Pravachol, Zocor   - myalgias Crestor, Lipitor, Pravachol, Zocor   - myalgias   Nitrofurantoin Rash   Other reaction(s): Other (See Comments) DIFFICULTY BREATHING and rash   Penicillins Rash   Sulfa Antibiotics Rash        Medication List        Accurate as of December 22, 2022  3:32 PM. If you have any questions, ask your nurse or doctor.  STOP taking these medications    aspirin 81 MG chewable tablet Stopped by: Meghan Bell   diphenhydrAMINE 25 mg capsule Commonly known as: BENADRYL Stopped by: Meghan Bell       TAKE these medications    acetaminophen 325 MG tablet Commonly known as: TYLENOL Take 650 mg by mouth every 6 (six) hours as needed.   citalopram 10 MG tablet Commonly known as: CELEXA Take 1 tablet by mouth daily. What changed: Another medication with the same name was removed. Continue taking this medication, and follow the directions you see here. Changed by: Meghan Bell   donepezil 10 MG tablet Commonly known as: ARICEPT Take 10 mg by mouth at bedtime.   fluticasone 50 MCG/ACT nasal spray Commonly known as: FLONASE Place into both  nostrils daily.   lovastatin 20 MG tablet Commonly known as: MEVACOR Take 20 mg by mouth at bedtime.   metoprolol succinate 25 MG 24 hr tablet Commonly known as: TOPROL-XL Take 25 mg by mouth daily.   nitroGLYCERIN 0.4 MG SL tablet Commonly known as: NITROSTAT Place 0.4 mg under the tongue every 5 (five) minutes as needed for chest pain.   pantoprazole 40 MG tablet Commonly known as: PROTONIX Take 1 tablet (40 mg total) by mouth daily.        Allergies:  Allergies  Allergen Reactions   Amoxicillin Other (See Comments)    Other reaction(s): Other (See Comments) Other Reaction: GI Upset Other Reaction: GI Upset    Erythromycin Shortness Of Breath and Rash   Influenza Vaccines Rash and Shortness Of Breath   Bacitracin Dermatitis   Elemental Sulfur    Other     Other reaction(s): Other (See Comments), Other (See Comments) Crestor, Lipitor, Pravachol, Zocor   - myalgias Crestor, Lipitor, Pravachol, Zocor   - myalgias    Nitrofurantoin Rash    Other reaction(s): Other (See Comments) DIFFICULTY BREATHING and rash    Penicillins Rash   Sulfa Antibiotics Rash    Family History: Family History  Problem Relation Age of Onset   Glaucoma Mother    Heart disease Mother    Heart disease Father    Migraines Father    Heart disease Sister    Hypertension Sister    Diabetes Sister    Diabetes Brother     Social History:  reports that she has never smoked. She has never used smokeless tobacco. She reports that she does not currently use alcohol. She reports that she does not use drugs.   Physical Exam: BP 106/64   Pulse 83   Ht 5\' 2"  (1.575 m)   Wt 150 lb (68 kg)   BMI 27.44 kg/m   Constitutional:  Alert and oriented, No acute distress. HEENT: [place] AT Respiratory: Normal respiratory effort, no increased work of breathing. Psychiatric: Normal mood and affect.   Urinalysis Dipstick trace protein, microscopy negative    Assessment & Plan:    1. Gross  hematuria AUA risk stratification: High We discussed the recommended evaluation of high risk hematuria which consist of CT urogram and cystoscopy.  The procedures were discussed in detail and he/she has elected to proceed with further evaluation All questions were answered CTU order placed and cystoscopy was scheduled   I have reviewed the above documentation for accuracy and completeness, and I agree with the above.   Meghan Altes, MD  [facility] [address] [place], [place] [address] [phone]

## 2022-12-23 LAB — URINALYSIS, COMPLETE
Bilirubin, UA: NEGATIVE
Glucose, UA: NEGATIVE
Ketones, UA: NEGATIVE
Leukocytes,UA: NEGATIVE
Nitrite, UA: NEGATIVE
RBC, UA: NEGATIVE
Specific Gravity, UA: >1.03 — ABNORMAL HIGH (ref 1.005–1.030)
Urobilinogen, Ur: 1 mg/dL (ref 0.2–1.0)
pH, UA: 5.5 (ref 5.0–7.5)

## 2022-12-23 LAB — MICROSCOPIC EXAMINATION: Epithelial Cells (non renal): >10 /[HPF] — AB (ref 0–10)

## 2023-01-06 ENCOUNTER — Ambulatory Visit
Admission: RE | Admit: 2023-01-06 | Discharge: 2023-01-06 | Disposition: A | Discharge status: Home / Self Care | Payer: Medicare PPO | Source: Ambulatory Visit | Attending: Urology | Admitting: Urology

## 2023-01-06 DIAGNOSIS — R310 Gross hematuria: Secondary | ICD-10-CM | POA: Diagnosis present

## 2023-01-06 LAB — POCT I-STAT CREATININE: Creatinine, Ser: 0.7 mg/dL (ref 0.44–1.00)

## 2023-01-06 MED ORDER — IOHEXOL 300 MG/ML  SOLN
100.0000 mL | Freq: Once | INTRAMUSCULAR | Status: AC | PRN
  Administered 2023-01-06: 100 mL via INTRAVENOUS

## 2023-01-17 NOTE — Progress Notes (Signed)
Notified patient as instructed, patient pleased. Discussed follow-up appointments, patient agrees  

## 2023-02-16 ENCOUNTER — Ambulatory Visit: Payer: Medicare PPO | Admitting: Urology

## 2023-02-16 ENCOUNTER — Encounter: Payer: Self-pay | Admitting: Urology

## 2023-02-16 DIAGNOSIS — R310 Gross hematuria: Secondary | ICD-10-CM

## 2023-02-16 LAB — URINALYSIS, COMPLETE
Bilirubin, UA: NEGATIVE
Glucose, UA: NEGATIVE
Ketones, UA: NEGATIVE
Leukocytes,UA: NEGATIVE
Nitrite, UA: POSITIVE — AB
RBC, UA: NEGATIVE
Specific Gravity, UA: >1.03 — ABNORMAL HIGH (ref 1.005–1.030)
Urobilinogen, Ur: 1 mg/dL (ref 0.2–1.0)
pH, UA: 5.5 (ref 5.0–7.5)

## 2023-02-16 LAB — MICROSCOPIC EXAMINATION

## 2023-02-16 NOTE — Progress Notes (Signed)
   02/16/23  CC:  Chief Complaint  Patient presents with   Cysto    HPI: Refer to my prior note 12/22/2022 1 brief episode of hematuria since last visit.  CTU 01/06/2023 showed no upper tract abnormalities.  NED. A&Ox3.   No respiratory distress   Abd soft, NT, ND Normal external genitalia with patent urethral meatus  Cystoscopy Procedure Note  Patient identification was confirmed, informed consent was obtained, and patient was prepped using Betadine solution.  Lidocaine jelly was administered per urethral meatus.    Procedure: - Flexible cystoscope introduced, without any difficulty.   - Thorough search of the bladder revealed:    normal urethral meatus    normal urothelium    no stones    no ulcers     no tumors    no urethral polyps    no trabeculation  - Ureteral orifices were normal in position and appearance.  Post-Procedure: - Patient tolerated the procedure well  Assessment/ Plan: No lower tract abnormalities noted on cystoscopy Bladder wash sent for cytology If cytology negative for 21-month follow-up with [facility] and instructed to call earlier for recurrent gross hematuria    Riki Altes, MD

## 2023-02-17 ENCOUNTER — Encounter: Payer: Self-pay | Admitting: Urology

## 2023-03-03 ENCOUNTER — Encounter: Payer: Self-pay | Admitting: Urology

## 2023-06-04 IMAGING — CT CT ANGIO HEAD-NECK (W OR W/O PERF)
2 of 7 series · 8 of 33 positions shown · IV contrast (APPLIED)
Comparison: Same day CT head.

CLINICAL DATA: Facial numbness

EXAM:
CT ANGIOGRAPHY HEAD AND NECK
TECHNIQUE: Multidetector CT imaging of the head and neck was performed using
the standard protocol during bolus administration of intravenous
contrast. Multiplanar CT image reconstructions and MIPs were
obtained to evaluate the vascular anatomy. Carotid stenosis
measurements (when applicable) are obtained utilizing NASCET
criteria, using the distal internal carotid diameter as the
denominator.
CONTRAST:  125mL OMNIPAQUE IOHEXOL 350 MG/ML SOLN

[Series 5: cta head neck · axial · 0.54mm/px · z∈[+322,+438]mm · 2 of 176 slices shown]
[im 59/176  soft-tissue]
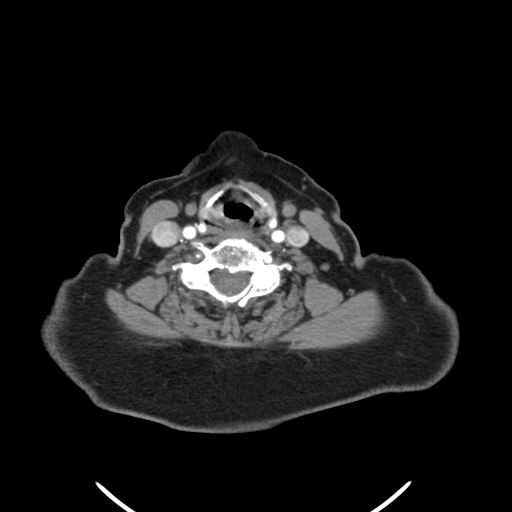
[im 117/176  soft-tissue]
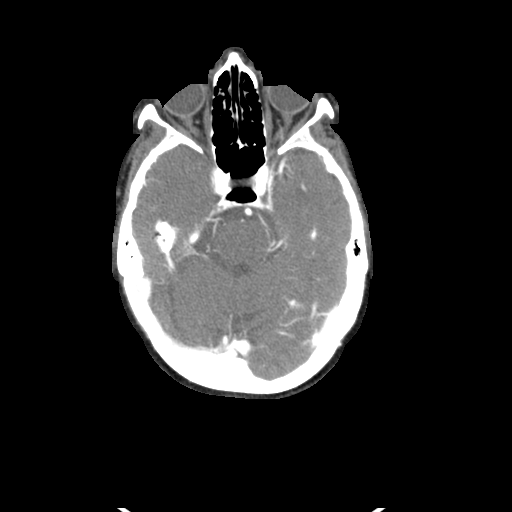

[Series 7: ax thin · axial · 0.47mm/px · z∈[+256,+500]mm · 6 of 344 slices shown]
[im 50/344  soft-tissue]
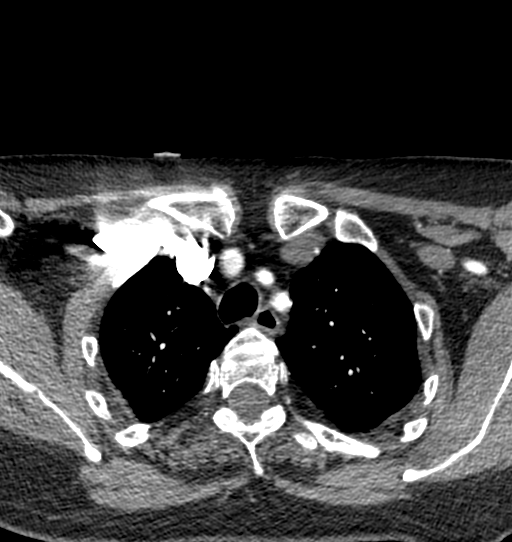
[im 99/344  bone]
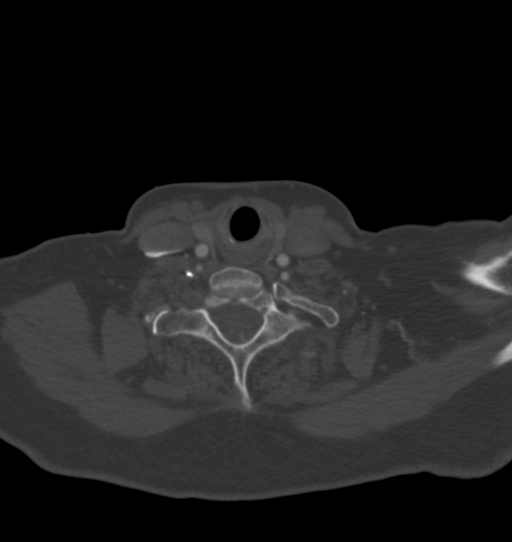
[im 148/344  soft-tissue]
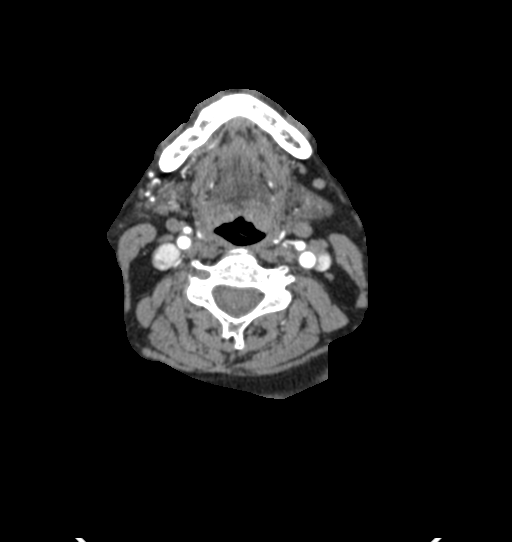
[im 197/344  bone]
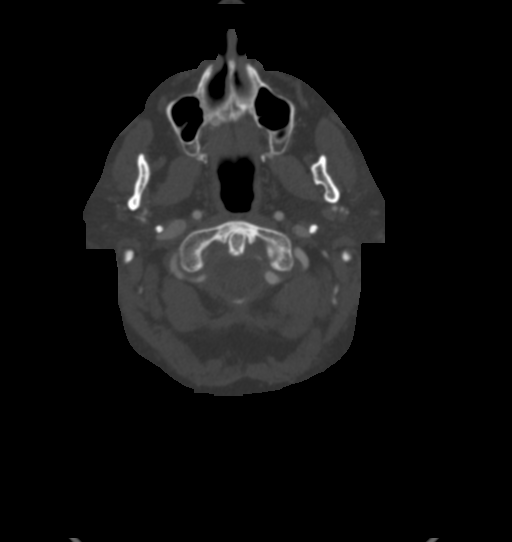
[im 246/344  soft-tissue]
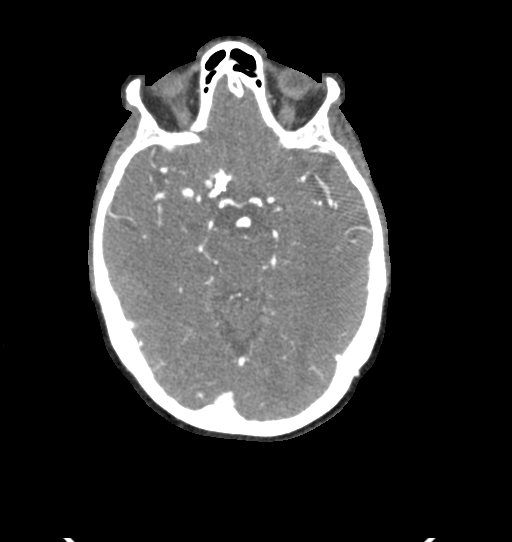
[im 295/344  bone]
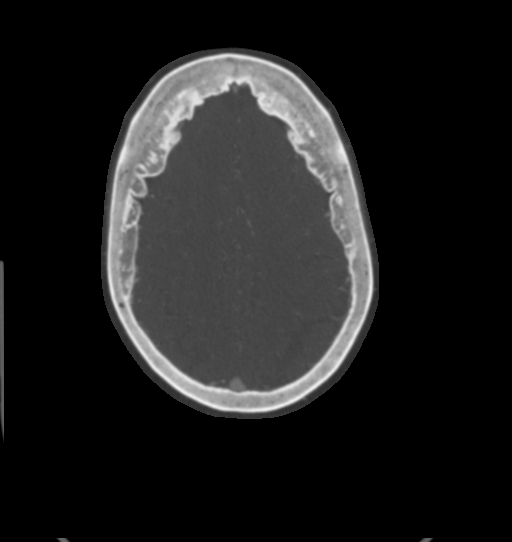

[8 of 33 positions shown; findings below may reference images not displayed]

FINDINGS: CT HEAD FINDINGS

Please see same day CT head without contrast.

CTA NECK FINDINGS

Aortic arch: Standard branching. Imaged portion shows no evidence of
aneurysm or dissection. No significant stenosis of the major arch
vessel origins.

Right carotid system: No evidence of dissection, stenosis (50% or
greater) or occlusion.

Left carotid system: No evidence of dissection, stenosis (50% or
greater) or occlusion.

Vertebral arteries: Evaluation of the origin the right vertebral
artery is limited by beam hardening artifact from adjacent venous
bolus. Within this limitation, there is likely stenosis at the
origin of the right vertebral artery, which is diminutive and
becomes non-opacified in the mid V2 segment (series 7, images
139-150), with distal reconstitution in the V3 segment (series 7,
image 199). The left vertebral artery is patent from the origin to
the V4 segment.

Skeleton: Multilevel degenerative changes in the cervical spine,
with osseous fusion of C5 and C6. Reversal of the normal cervical
lordosis.

Other neck: Multiple hypoattenuating nodules in the thyroid, right
greater than left lobe, the largest of which measures up to 1.0 cm.

Upper chest: No focal pulmonary opacity or pleural effusion.

Review of the MIP images confirms the above findings

CTA HEAD FINDINGS

Anterior circulation: Sequela of prior right ICA aneurysm repair.
Suspect right cavernous ICA stent, which appears patent. Both
internal carotid arteries are patent to the termini, without
stenosis or other abnormality. A1 segments patent. Normal anterior
communicating artery. Anterior cerebral arteries are patent to their
distal aspects. No M1 stenosis or occlusion. Normal MCA
bifurcations. Distal MCA branches perfused and symmetric.

Posterior circulation: Narrowing of the distal right V3 and V4
segments (series 7, image 200, 214, 222). Left vertebral artery is
dpatent to the vertebrobasilar junction without stenosis. Posterior
inferior cerebral arteries patent bilaterally. Basilar patent to its
distal aspect. Superior cerebral arteries patent bilaterally. PCAs
well perfused to their distal aspects without stenosis.

Venous sinuses: As permitted by contrast timing, patent.

Anatomic variants: Bilateral posterior communicating arteries are
not visualized.

Review of the MIP images confirms the above findings
IMPRESSION: 1. Narrowing and complete occlusion of the right V2 segment, with
distal reconstitution in the V3 segment. In addition, there is
multifocal narrowing of the distal right V3 and V4 segments, which
remain patent. There is likely stenosis at the origin of the right
vertebral artery; however, evaluation is limited by beam hardening
artifact from the adjacent venous bolus.
2. No intracranial vessel occlusion.

## 2023-06-04 IMAGING — CT CT HEAD CODE STROKE
4 series · 16 of 47 positions shown, 18 images · non-contrast
Comparison: None.

CLINICAL DATA: Code stroke. Left-sided numbness and chest pain,
left-sided facial droop

EXAM:
CT HEAD WITHOUT CONTRAST
TECHNIQUE: Contiguous axial images were obtained from the base of the skull
through the vertex without intravenous contrast.

[Series 3: head wo · axial · 0.41mm/px · z∈[+395,+505]mm · 7 of 30 slices shown, 9 images]
[im 4/30  brain]
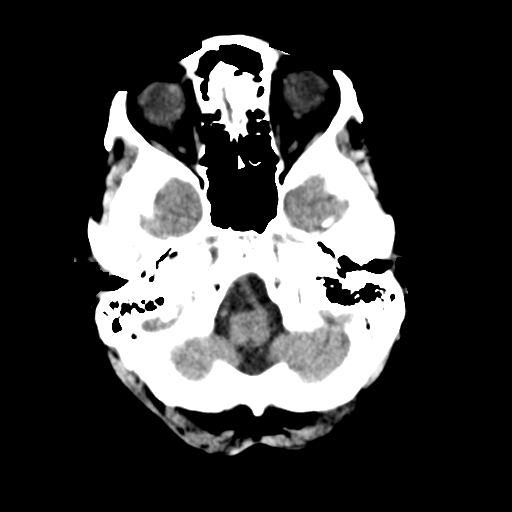
[im 4/30  bone]
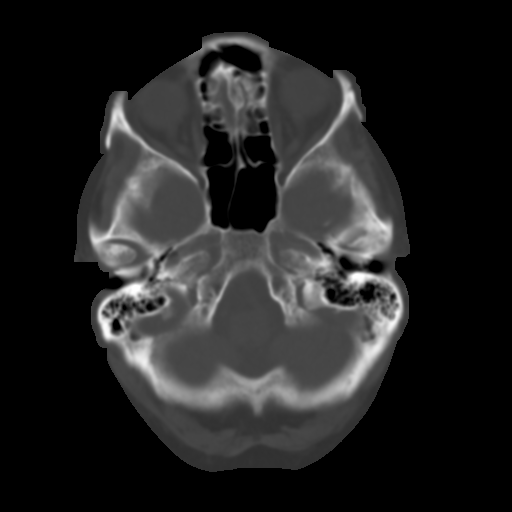
[im 8/30  brain]
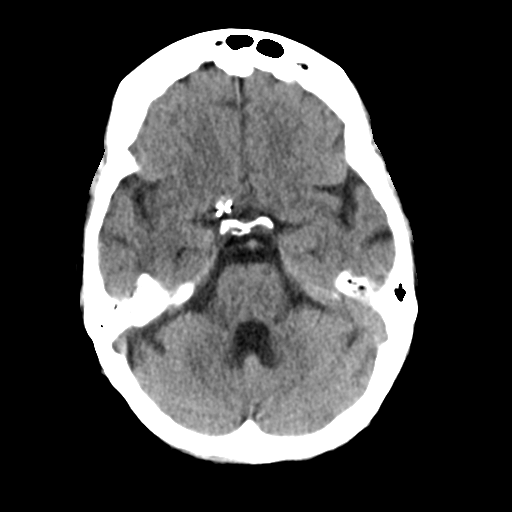
[im 11/30  brain]
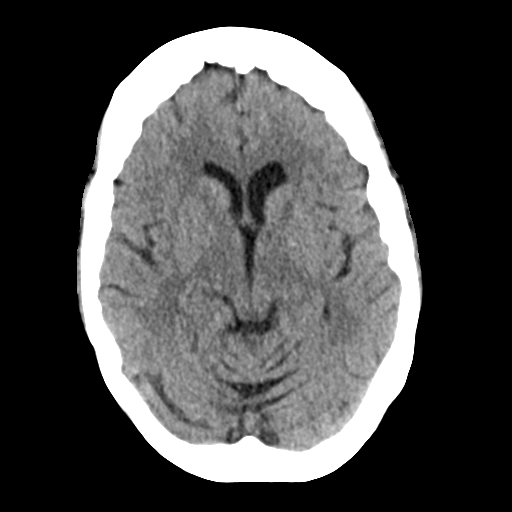
[im 15/30  brain]
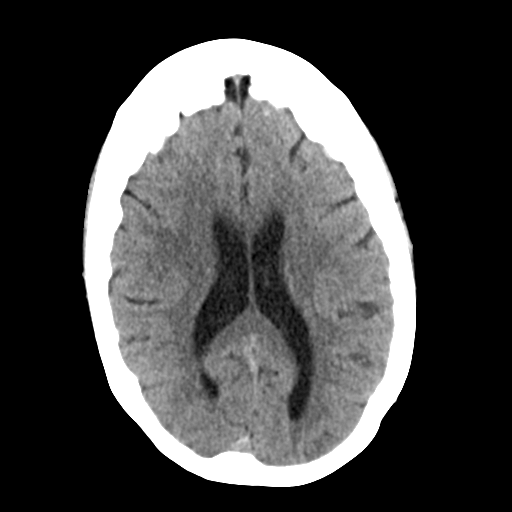
[im 19/30  brain]
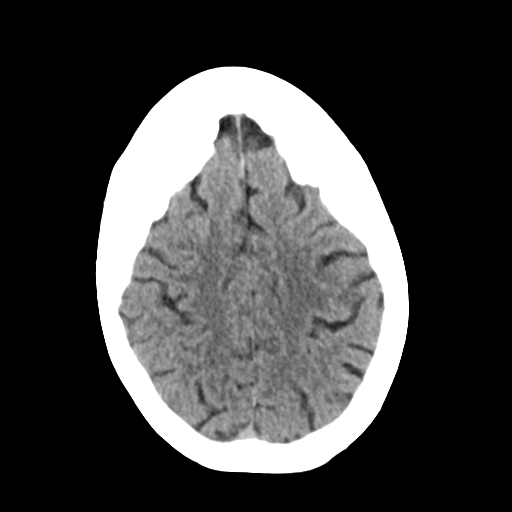
[im 19/30  bone]
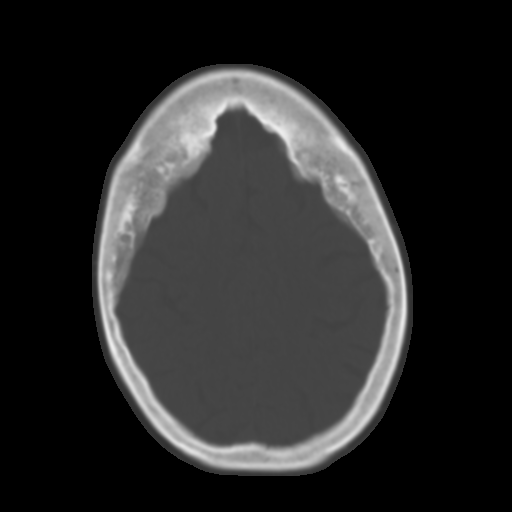
[im 22/30  brain]
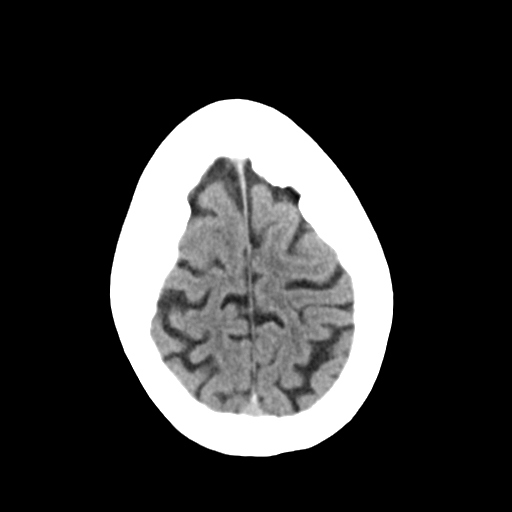
[im 26/30  brain]
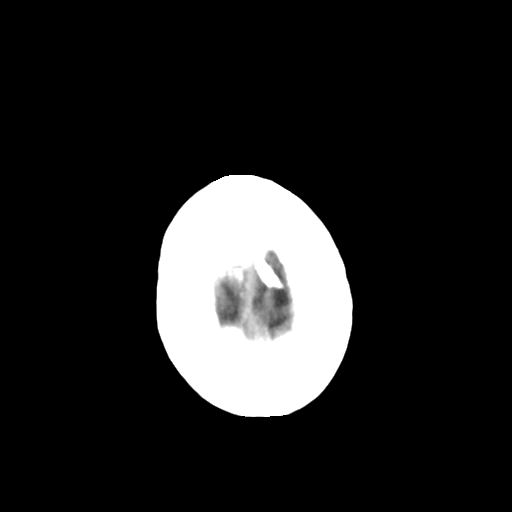

[Series 4: head bone · axial · 0.41mm/px · z∈[+394,+424]mm · 3 of 75 slices shown]
[im 8/75  bone]
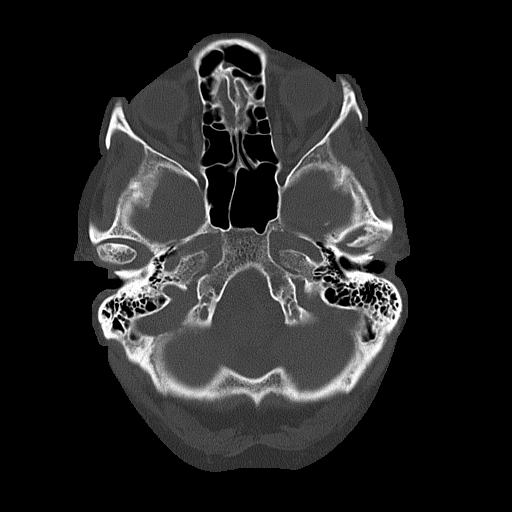
[im 15/75  bone]
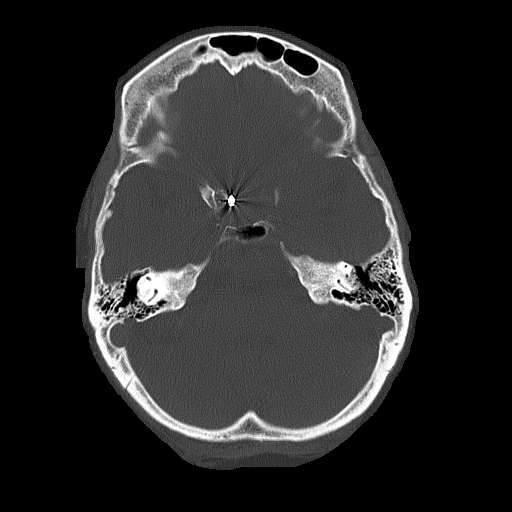
[im 23/75  bone]
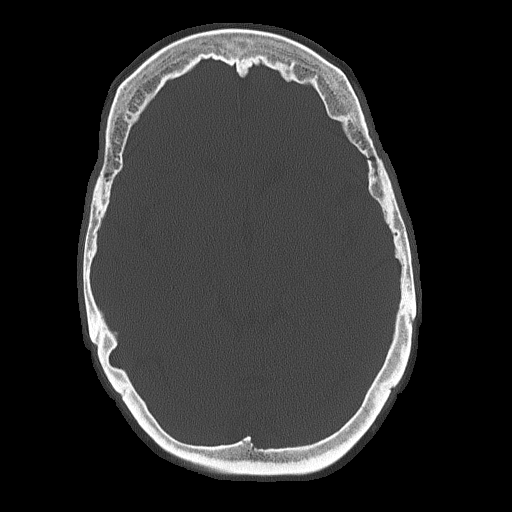

[Series 5: coronal soft tissue · coronal · 0.32mm/px · 3 of 64 slices shown]
[im 22/64  brain]
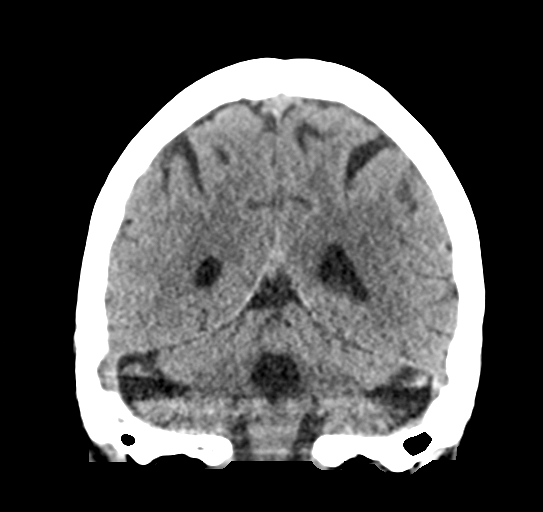
[im 29/64  brain]
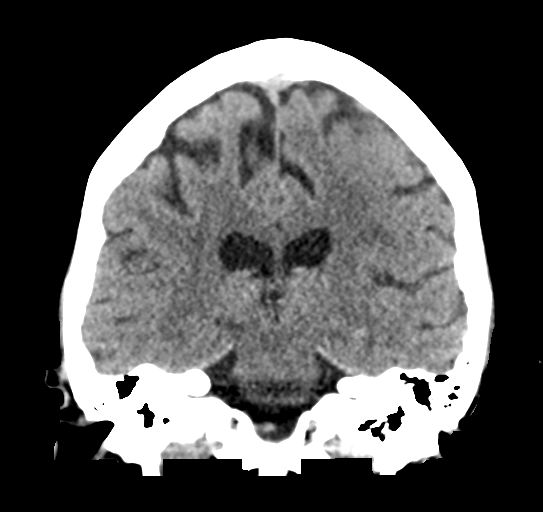
[im 36/64  brain]
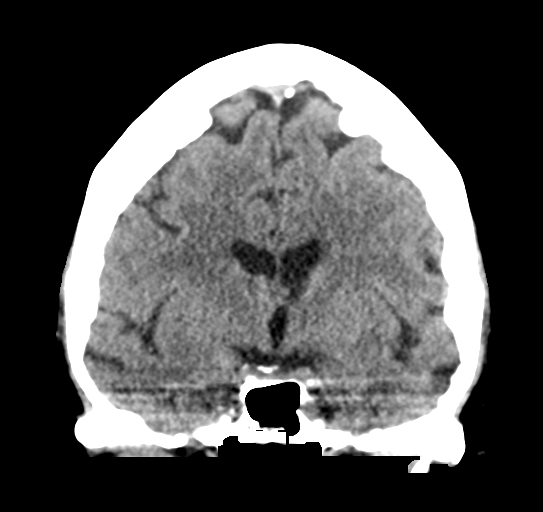

[Series 6: sagittal soft tissue · sagittal · 0.32mm/px · 3 of 52 slices shown]
[im 18/52  brain]
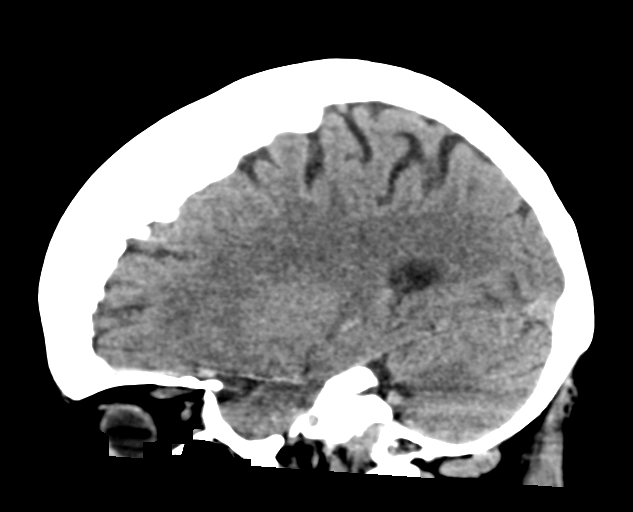
[im 26/52  brain]
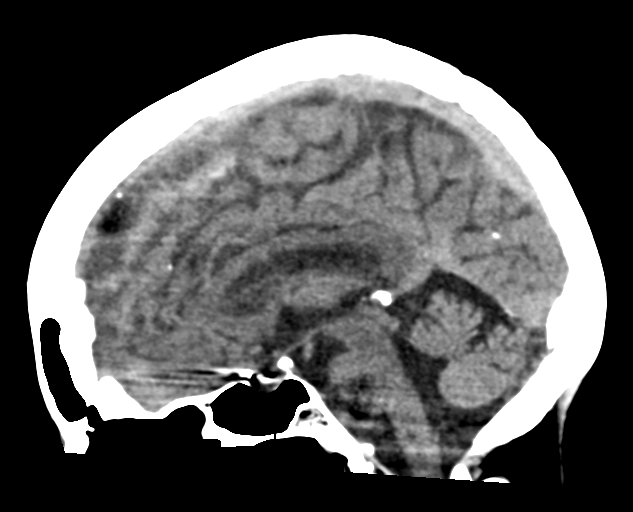
[im 35/52  brain]
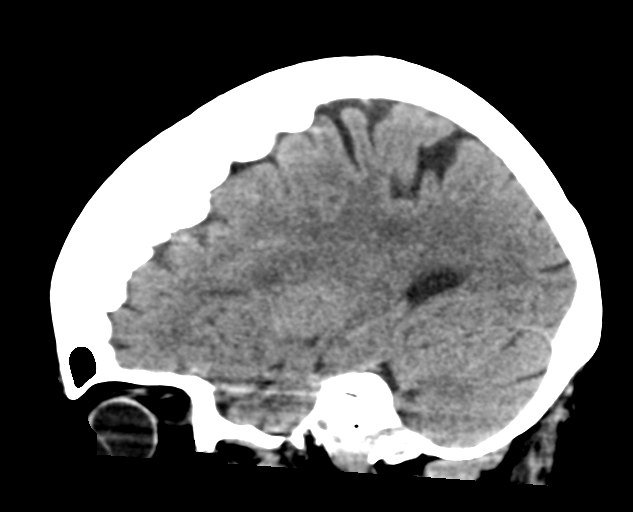

[16 of 47 positions shown; findings below may reference images not displayed]

FINDINGS: Brain: No evidence of acute infarction, hemorrhage, cerebral edema,
mass, mass effect, or midline shift. Ventricles and sulci are normal
for age. No extra-axial fluid collection.

Vascular: No hyperdense vessel or unexpected calcification.

Skull: Normal. Negative for fracture or focal lesion. Hyperostosis
frontalis

Sinuses/Orbits: No acute finding. Status post bilateral lens
replacements.

Other: The mastoid air cells are well aerated.

ASPECTS (Alberta Stroke Program Early CT Score)

- Ganglionic level infarction (caudate, lentiform nuclei, internal
capsule, insula, M1-M3 cortex): 7

- Supraganglionic infarction (M4-M6 cortex): 3

Total score (0-10 with 10 being normal): 10
IMPRESSION: 1. No acute intracranial process.
2. ASPECTS is 10

Code stroke imaging results were communicated on 12/01/2020 at [DATE] to provider Dr. Michmich via telephone, who verbally acknowledged
these results.

## 2023-07-16 ENCOUNTER — Other Ambulatory Visit: Payer: Self-pay

## 2023-07-16 ENCOUNTER — Encounter: Payer: Self-pay | Admitting: Emergency Medicine

## 2023-07-16 ENCOUNTER — Emergency Department

## 2023-07-16 ENCOUNTER — Emergency Department
Admission: EM | Admit: 2023-07-16 | Discharge: 2023-07-16 | Disposition: A | Discharge status: Home / Self Care | Attending: Emergency Medicine | Admitting: Emergency Medicine

## 2023-07-16 DIAGNOSIS — M25561 Pain in right knee: Secondary | ICD-10-CM | POA: Insufficient documentation

## 2023-07-16 DIAGNOSIS — Z7902 Long term (current) use of antithrombotics/antiplatelets: Secondary | ICD-10-CM | POA: Insufficient documentation

## 2023-07-16 DIAGNOSIS — E119 Type 2 diabetes mellitus without complications: Secondary | ICD-10-CM | POA: Insufficient documentation

## 2023-07-16 DIAGNOSIS — R1030 Lower abdominal pain, unspecified: Secondary | ICD-10-CM | POA: Diagnosis not present

## 2023-07-16 DIAGNOSIS — S0990XA Unspecified injury of head, initial encounter: Secondary | ICD-10-CM | POA: Diagnosis present

## 2023-07-16 DIAGNOSIS — I10 Essential (primary) hypertension: Secondary | ICD-10-CM | POA: Insufficient documentation

## 2023-07-16 DIAGNOSIS — D75839 Thrombocytosis, unspecified: Secondary | ICD-10-CM | POA: Diagnosis not present

## 2023-07-16 DIAGNOSIS — I2510 Atherosclerotic heart disease of native coronary artery without angina pectoris: Secondary | ICD-10-CM | POA: Diagnosis not present

## 2023-07-16 DIAGNOSIS — W19XXXA Unspecified fall, initial encounter: Secondary | ICD-10-CM

## 2023-07-16 LAB — CBC WITH DIFFERENTIAL/PLATELET
Abs Immature Granulocytes: 0.09 10*3/uL — ABNORMAL HIGH (ref 0.00–0.07)
Basophils Absolute: 0.3 10*3/uL — ABNORMAL HIGH (ref 0.0–0.1)
Basophils Relative: 4 %
Eosinophils Absolute: 0.4 10*3/uL (ref 0.0–0.5)
Eosinophils Relative: 4 %
HCT: 37.6 % (ref 36.0–46.0)
Hemoglobin: 12.1 g/dL (ref 12.0–15.0)
Immature Granulocytes: 1 %
Lymphocytes Relative: 34 %
Lymphs Abs: 2.9 10*3/uL (ref 0.7–4.0)
MCH: 31.9 pg (ref 26.0–34.0)
MCHC: 32.2 g/dL (ref 30.0–36.0)
MCV: 99.2 fL (ref 80.0–100.0)
Monocytes Absolute: 0.9 10*3/uL (ref 0.1–1.0)
Monocytes Relative: 11 %
Neutro Abs: 3.9 10*3/uL (ref 1.7–7.7)
Neutrophils Relative %: 46 %
Platelets: 926 10*3/uL (ref 150–400)
RBC: 3.79 MIL/uL — ABNORMAL LOW (ref 3.87–5.11)
RDW: 18.9 % — ABNORMAL HIGH (ref 11.5–15.5)
WBC: 8.5 10*3/uL (ref 4.0–10.5)
nRBC: 0.4 % — ABNORMAL HIGH (ref 0.0–0.2)

## 2023-07-16 LAB — COMPREHENSIVE METABOLIC PANEL WITH GFR
ALT: 29 U/L (ref 0–44)
AST: 40 U/L (ref 15–41)
Albumin: 3.8 g/dL (ref 3.5–5.0)
Alkaline Phosphatase: 54 U/L (ref 38–126)
Anion gap: 11 (ref 5–15)
BUN: 9 mg/dL (ref 8–23)
CO2: 24 mmol/L (ref 22–32)
Calcium: 9.1 mg/dL (ref 8.9–10.3)
Chloride: 105 mmol/L (ref 98–111)
Creatinine, Ser: 0.53 mg/dL (ref 0.44–1.00)
GFR, Estimated: >60 mL/min (ref >60)
Glucose, Bld: 133 mg/dL — ABNORMAL HIGH (ref 70–99)
Potassium: 3.9 mmol/L (ref 3.5–5.1)
Sodium: 140 mmol/L (ref 135–145)
Total Bilirubin: 1.7 mg/dL — ABNORMAL HIGH (ref 0.0–1.2)
Total Protein: 7.2 g/dL (ref 6.5–8.1)

## 2023-07-16 LAB — URINALYSIS, ROUTINE W REFLEX MICROSCOPIC
Bacteria, UA: NONE SEEN
Bilirubin Urine: NEGATIVE
Glucose, UA: NEGATIVE mg/dL
Hgb urine dipstick: NEGATIVE
Ketones, ur: NEGATIVE mg/dL
Nitrite: NEGATIVE
Protein, ur: NEGATIVE mg/dL
Specific Gravity, Urine: 1.019 (ref 1.005–1.030)
pH: 5 (ref 5.0–8.0)

## 2023-07-16 LAB — CK: Total CK: 26 U/L — ABNORMAL LOW (ref 38–234)

## 2023-07-16 MED ORDER — ACETAMINOPHEN 500 MG PO TABS
1000.0000 mg | ORAL_TABLET | Freq: Once | ORAL | Status: AC
  Administered 2023-07-16: 1000 mg via ORAL
  Filled 2023-07-16: qty 2

## 2023-07-16 NOTE — ED Triage Notes (Signed)
 Patient from home via [facility] with c/o fall last night. Patient lives independently.  Family came to her house and found her on floor, when they could not get into contact with her. Pt c/o right knee pain, describing pain as feels numb. Denies Blood thinner use. Does not remember what caused her to fall, but reported she has been on floor since last night. cbg 129. Patient is alert and oriented x4 at this time.

## 2023-07-16 NOTE — ED Provider Notes (Addendum)
 [facility] Provider Note    Event Date/Time   First MD Initiated Contact with Patient 07/16/23 2142     (approximate)   History   Fall   HPI  Meghan Bell is a 83 y.o. female who presents to the ED for evaluation of Fall   I review a PCP visit from March.  History of HTN, GERD, CAD, DM on Plavix .  Patient presents from home after being found down by family.  Patient lives alone but has family living nearby checking in on her daily.  History is supplemented by her daughter and son-in-law.  Overnight last night perhaps 16-18 hours ago, patient was getting out of bed to void when she stumbled and fell and struck her right knee and her left-sided forehead.  Does not think that she experienced syncope.  She had trouble getting up until her family found her down this evening.   Patient reports feeling okay.  Reports chronic right knee pain that is acutely worse in the medial side since the fall   Physical Exam   Triage Vital Signs: ED Triage Vitals  Encounter Vitals Group     BP 07/16/23 2130 (!) 120/59     Girls Systolic BP Percentile --      Girls Diastolic BP Percentile --      Boys Systolic BP Percentile --      Boys Diastolic BP Percentile --      Pulse Rate 07/16/23 2130 75     Resp 07/16/23 2130 18     Temp 07/16/23 2130 98.5 F (36.9 C)     Temp Source 07/16/23 2130 Oral     SpO2 07/16/23 2130 96 %     Weight 07/16/23 2131 149 lb 14.6 oz (68 kg)     Height 07/16/23 2131 5' 2 (1.575 m)     Head Circumference --      Peak Flow --      Pain Score 07/16/23 2131 0     Pain Loc --      Pain Education --      Exclude from Growth Chart --     Most recent vital signs: Vitals:   07/16/23 2200 07/16/23 2230  BP: (!) 114/56 (!) 107/92  Pulse: 80 73  Resp: 20 (!) 21  Temp:    SpO2: 95% 96%    General: Awake, no distress.  Well-appearing and conversational CV:  Good peripheral perfusion.  Resp:  Normal effort.  Abd:  No distention.   Minimal suprapubic tenderness, otherwise benign abdomen. MSK:  No deformity noted.  Ranging all 4 extremities.  Does have some mild tenderness throughout the medial aspect of the right knee but otherwise palpation of all 4 extremities without evidence of deformity, tenderness or trauma Neuro:  No focal deficits appreciated. Other:     ED Results / Procedures / Treatments   Labs (all labs ordered are listed, but only abnormal results are displayed) Labs Reviewed  CBC WITH DIFFERENTIAL/PLATELET - Abnormal; Notable for the following components:      Result Value   RBC 3.79 (*)    RDW 18.9 (*)    Platelets 926 (*)    nRBC 0.4 (*)    Basophils Absolute 0.3 (*)    Abs Immature Granulocytes 0.09 (*)    All other components within normal limits  COMPREHENSIVE METABOLIC PANEL WITH GFR - Abnormal; Notable for the following components:   Glucose, Bld 133 (*)    Total Bilirubin 1.7 (*)  All other components within normal limits  CK - Abnormal; Notable for the following components:   Total CK 26 (*)    All other components within normal limits  URINALYSIS, ROUTINE W REFLEX MICROSCOPIC - Abnormal; Notable for the following components:   Color, Urine YELLOW (*)    APPearance HAZY (*)    Leukocytes,Ua TRACE (*)    All other components within normal limits    EKG Sinus rhythm with a rate of 79 bpm.  Normal axis and intervals without clear signs of acute ischemia.  RADIOLOGY CT head interpreted by me without evidence of acute intracranial pathology CT cervical spine interpreted by me without evidence of fracture or dislocation  Official radiology report(s): DG Knee Complete 4 Views Right Result Date: 07/16/2023 CLINICAL DATA:  fall, medial pain EXAM: RIGHT KNEE - COMPLETE 4+ VIEW COMPARISON:  X-ray knee 03/26/2022 FINDINGS: No evidence of fracture, dislocation, or joint effusion. Recommend or moderate degenerative changes of the knee. No aggressive appearing focal bone abnormality. Soft  tissues are unremarkable. IMPRESSION: No acute displaced fracture or dislocation. Electronically Signed   By: Morgane  Naveau M.D.   On: 07/16/2023 22:38   CT HEAD WO CONTRAST ( ) Result Date: 07/16/2023 EXAM: CT HEAD WITHOUT CONTRAST 07/16/2023 10:14:12 PM TECHNIQUE: CT of the head was performed without the administration of intravenous contrast. Automated exposure control, iterative reconstruction, and/or weight based adjustment of the mA/kV was utilized to reduce the radiation dose to as low as reasonably achievable. COMPARISON: 03/26/2022 CLINICAL HISTORY: Fall, left forehead trauma. Patient found on floor by family after fall last night. Complains of right knee pain, describing pain as feels numb. Denies blood thinner use. Does not remember what caused her to fall. FINDINGS: BRAIN AND VENTRICLES: There is no acute intracranial hemorrhage, mass effect or midline shift. No abnormal extra-axial fluid collection. The gray-white differentiation is maintained without an acute infarct. There is no hydrocephalus. Global cortical atrophy. Subcortical and periventricular small vessel ischemic changes. ORBITS: The visualized portion of the orbits demonstrate no acute abnormality. SINUSES: The visualized paranasal sinuses and mastoid air cells demonstrate no acute abnormality. SOFT TISSUES AND SKULL: No acute abnormality of the visualized skull or soft tissues. VASCULATURE: Right intracranial ICA stent. IMPRESSION: 1. No acute intracranial abnormality. Atrophy with small vessel ischemic changes. 2. No traumatic injury to the cervical spine. Mild degenerative changes. Electronically signed by: Zadie Herter MD 07/16/2023 10:22 PM EDT RP Workstation: [id number]   CT Cervical Spine Wo Contrast Result Date: 07/16/2023 EXAM: CT HEAD WITHOUT CONTRAST 07/16/2023 10:14:12 PM TECHNIQUE: CT of the head was performed without the administration of intravenous contrast. Automated exposure control, iterative reconstruction,  and/or weight based adjustment of the mA/kV was utilized to reduce the radiation dose to as low as reasonably achievable. COMPARISON: 03/26/2022 CLINICAL HISTORY: Fall, left forehead trauma. Patient found on floor by family after fall last night. Complains of right knee pain, describing pain as feels numb. Denies blood thinner use. Does not remember what caused her to fall. FINDINGS: BRAIN AND VENTRICLES: There is no acute intracranial hemorrhage, mass effect or midline shift. No abnormal extra-axial fluid collection. The gray-white differentiation is maintained without an acute infarct. There is no hydrocephalus. Global cortical atrophy. Subcortical and periventricular small vessel ischemic changes. ORBITS: The visualized portion of the orbits demonstrate no acute abnormality. SINUSES: The visualized paranasal sinuses and mastoid air cells demonstrate no acute abnormality. SOFT TISSUES AND SKULL: No acute abnormality of the visualized skull or soft tissues. VASCULATURE: Right intracranial ICA stent. IMPRESSION:  1. No acute intracranial abnormality. Atrophy with small vessel ischemic changes. 2. No traumatic injury to the cervical spine. Mild degenerative changes. Electronically signed by: Zadie Herter MD 07/16/2023 10:22 PM EDT RP Workstation: [id number]    PROCEDURES and INTERVENTIONS:  Procedures  Medications  acetaminophen  (TYLENOL ) tablet 1,000 mg (1,000 mg Oral Given 07/16/23 2203)     IMPRESSION / MDM / ASSESSMENT AND PLAN / ED COURSE  I reviewed the triage vital signs and the nursing notes.  Differential diagnosis includes, but is not limited to, femur fracture, patellar dislocation, ICH, rhabdomyolysis, AKI, acute UTI or sepsis  {Patient presents with symptoms of an acute illness or injury that is potentially life-threatening.  Pleasant woman who lives independently presents after being found down from a fall.  Despite prolonged downtime her CK is normal she looks generally well on  exam.  Some mild knee tenderness but is ranging it fully.  Serum workup demonstrates thrombocytosis of uncertain etiology.  All other cell lines are generally normal.  This is possibly reactive from the acute stress of being down and also pending a UA to assess for cystitis considering her mild suprapubic tenderness on exam I could generate an acute inflammatory response.  She has no signs or symptoms of acute thrombotic disease such as a STEMI, stroke or more severe pathology secondary to this thrombocytosis.  She is a normal CBC.  She is pending UA at the time of signout to oncoming physician.  I believe she will be suitable for outpatient management plus or minus antibiotics based off of the UA.  Regarding her thrombocytosis I will refer her to medical oncology for clinic follow-up and inform the patient and family of these results.  UA returns without clear infectious features considering her lack of symptoms.  No indication for antibiotics.  I considered admission for this patient but believe a trial of outpatient management with close hematologic follow-up would be reasonable.  Discussed close return precautions.  Suitable for outpatient management.     FINAL CLINICAL IMPRESSION(S) / ED DIAGNOSES   Final diagnoses:  Fall, initial encounter  Thrombocytosis     Rx / DC Orders   ED Discharge Orders          Ordered    Ambulatory referral to Hematology / Oncology       Comments: Your emergency department provider has referred you to see a hematology/oncology specialist. These are physicians who specialize in blood disorders and cancers, or findings concerning for cancer. You will receive a phone call from the [facility] Office to set up your appointment within 2 business days: [employer] operate Mon - Fri, 8:00 a.m. to 5:00 p.m.; closed for federally recognized holidays. Please be sure your phone is not set to block numbers during this time.   07/16/23 2252              Note:  This document was prepared using Dragon voice recognition software and may include unintentional dictation errors.   Arline Bennett, MD 07/16/23 0981    Arline Bennett, MD 07/16/23 1914    Arline Bennett, MD 07/16/23 [phone]

## 2023-07-16 NOTE — ED Notes (Signed)
 Pt complaining of Rt leg numbness... No weakness noted. Stroke screen neg. Stated that she thinks the numbness is what made her fall

## 2023-07-16 NOTE — Discharge Instructions (Signed)
 As we discussed, her platelet counts were very high for an uncertain reason.  We have placed a referral to the blood doctor/hematologist to call you to schedule an appointment in the clinic to obtain further testing about this.  If she develops any severe chest pains, strokelike symptoms or other concerning features then please return to the ED immediately.

## 2023-07-21 ENCOUNTER — Inpatient Hospital Stay: Attending: Oncology | Admitting: Oncology

## 2023-07-21 ENCOUNTER — Telehealth: Payer: Self-pay

## 2023-07-21 ENCOUNTER — Other Ambulatory Visit: Payer: Self-pay | Admitting: Oncology

## 2023-07-21 ENCOUNTER — Inpatient Hospital Stay

## 2023-07-21 ENCOUNTER — Encounter: Payer: Self-pay | Admitting: Oncology

## 2023-07-21 VITALS — BP 113/62 | HR 84 | Temp 99.0°F | Resp 18 | Ht 62.0 in | Wt 153.6 lb

## 2023-07-21 DIAGNOSIS — Z8673 Personal history of transient ischemic attack (TIA), and cerebral infarction without residual deficits: Secondary | ICD-10-CM | POA: Insufficient documentation

## 2023-07-21 DIAGNOSIS — D75839 Thrombocytosis, unspecified: Secondary | ICD-10-CM

## 2023-07-21 DIAGNOSIS — Z83511 Family history of glaucoma: Secondary | ICD-10-CM | POA: Diagnosis not present

## 2023-07-21 DIAGNOSIS — Z881 Allergy status to other antibiotic agents status: Secondary | ICD-10-CM | POA: Diagnosis not present

## 2023-07-21 DIAGNOSIS — Z90721 Acquired absence of ovaries, unilateral: Secondary | ICD-10-CM | POA: Diagnosis not present

## 2023-07-21 DIAGNOSIS — Z9049 Acquired absence of other specified parts of digestive tract: Secondary | ICD-10-CM | POA: Insufficient documentation

## 2023-07-21 DIAGNOSIS — Z882 Allergy status to sulfonamides status: Secondary | ICD-10-CM | POA: Insufficient documentation

## 2023-07-21 DIAGNOSIS — I10 Essential (primary) hypertension: Secondary | ICD-10-CM | POA: Diagnosis not present

## 2023-07-21 DIAGNOSIS — Z90710 Acquired absence of both cervix and uterus: Secondary | ICD-10-CM | POA: Diagnosis not present

## 2023-07-21 DIAGNOSIS — Z8249 Family history of ischemic heart disease and other diseases of the circulatory system: Secondary | ICD-10-CM | POA: Insufficient documentation

## 2023-07-21 DIAGNOSIS — C946 Myelodysplastic disease, not classified: Secondary | ICD-10-CM | POA: Diagnosis not present

## 2023-07-21 DIAGNOSIS — Z833 Family history of diabetes mellitus: Secondary | ICD-10-CM | POA: Diagnosis not present

## 2023-07-21 DIAGNOSIS — Z7902 Long term (current) use of antithrombotics/antiplatelets: Secondary | ICD-10-CM | POA: Diagnosis not present

## 2023-07-21 DIAGNOSIS — Z887 Allergy status to serum and vaccine status: Secondary | ICD-10-CM | POA: Diagnosis not present

## 2023-07-21 DIAGNOSIS — Z79899 Other long term (current) drug therapy: Secondary | ICD-10-CM | POA: Diagnosis not present

## 2023-07-21 DIAGNOSIS — Z809 Family history of malignant neoplasm, unspecified: Secondary | ICD-10-CM | POA: Diagnosis not present

## 2023-07-21 DIAGNOSIS — Z880 Allergy status to penicillin: Secondary | ICD-10-CM | POA: Diagnosis not present

## 2023-07-21 LAB — CBC WITH DIFFERENTIAL/PLATELET
Abs Immature Granulocytes: 0.14 10*3/uL — ABNORMAL HIGH (ref 0.00–0.07)
Basophils Absolute: 0.3 10*3/uL — ABNORMAL HIGH (ref 0.0–0.1)
Basophils Relative: 3 %
Eosinophils Absolute: 0.4 10*3/uL (ref 0.0–0.5)
Eosinophils Relative: 4 %
HCT: 33.6 % — ABNORMAL LOW (ref 36.0–46.0)
Hemoglobin: 11 g/dL — ABNORMAL LOW (ref 12.0–15.0)
Immature Granulocytes: 1 %
Lymphocytes Relative: 22 %
Lymphs Abs: 2.1 10*3/uL (ref 0.7–4.0)
MCH: 32.3 pg (ref 26.0–34.0)
MCHC: 32.7 g/dL (ref 30.0–36.0)
MCV: 98.5 fL (ref 80.0–100.0)
Monocytes Absolute: 1.1 10*3/uL — ABNORMAL HIGH (ref 0.1–1.0)
Monocytes Relative: 11 %
Neutro Abs: 5.7 10*3/uL (ref 1.7–7.7)
Neutrophils Relative %: 59 %
Platelets: 821 10*3/uL — ABNORMAL HIGH (ref 150–400)
RBC: 3.41 MIL/uL — ABNORMAL LOW (ref 3.87–5.11)
RDW: 18.7 % — ABNORMAL HIGH (ref 11.5–15.5)
WBC: 9.7 10*3/uL (ref 4.0–10.5)
nRBC: 0.4 % — ABNORMAL HIGH (ref 0.0–0.2)

## 2023-07-21 LAB — IRON AND TIBC
Iron: 83 ug/dL (ref 28–170)
Saturation Ratios: 18 % (ref 10.4–31.8)
TIBC: 466 ug/dL — ABNORMAL HIGH (ref 250–450)
UIBC: 383 ug/dL

## 2023-07-21 LAB — FERRITIN: Ferritin: 28 ng/mL (ref 11–307)

## 2023-07-21 LAB — SEDIMENTATION RATE: Sed Rate: 19 mm/h (ref 0–30)

## 2023-07-21 MED ORDER — HYDROXYUREA 500 MG PO CAPS: 500.0000 mg | ORAL_CAPSULE | Freq: Every day | ORAL | 0 refills | Status: AC

## 2023-07-21 NOTE — Progress Notes (Signed)
 Hematology/Oncology Consult note [facility] Telephone:([phone] [phone]  Patient Care Team: Jimmy Moulding, MD as PCP - General (Internal Medicine)   Name of the patient: Meghan Bell  [id number]  03-25-1940    Reason for referral- thrombocytosis   Referring physician-Dr. 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  She presented to the ER on 07/16/2023 following a fall and incidentally CBC showed white count of 8.5, H&H of 12.1/37.6 and a platelet count of 926.  In February 2024 her platelet count was 404.  Patient was previously on Plavix  up until September 2024 but each time she started Plavix  for her TIA she began to experience hematuria.  She had a comprehensive hematuria workup which was negative for any acute pathology.  She has therefore remained off Plavix  since September 2024.  She is not presently on any blood thinners.  She lives alone and is independent of her ADLs.  ECOG PS- 1  Pain scale- 0   Review of systems- Review of Systems  Constitutional:  Negative for chills, fever, malaise/fatigue and weight loss.  HENT:  Negative for congestion, ear discharge and nosebleeds.   Eyes:  Negative for blurred vision.  Respiratory:  Negative for cough, hemoptysis, sputum production, shortness of breath and wheezing.   Cardiovascular:  Negative for chest pain, palpitations, orthopnea and claudication.  Gastrointestinal:  Negative for abdominal pain, blood in stool, constipation, diarrhea, heartburn, melena, nausea and vomiting.  Genitourinary:  Negative for dysuria, flank pain, frequency, hematuria and urgency.  Musculoskeletal:  Negative for back pain, joint pain and myalgias.  Skin:  Negative for rash.  Neurological:   Negative for dizziness, tingling, focal weakness, seizures, weakness and headaches.  Endo/Heme/Allergies:  Does not bruise/bleed easily.  Psychiatric/Behavioral:  Negative for depression and suicidal ideas. The patient does not have insomnia.     Allergies  Allergen Reactions   Amoxicillin Other (See Comments)    Other reaction(s): Other (See Comments) Other Reaction: GI Upset Other Reaction: GI Upset    Erythromycin Shortness Of Breath and Rash   Influenza Vaccines Rash and Shortness Of Breath   Bacitracin Dermatitis   Elemental Sulfur    Other     Other reaction(s): Other (See Comments), Other (See Comments) Crestor, Lipitor, Pravachol , Zocor   - myalgias Crestor, Lipitor, Pravachol , Zocor   - myalgias    Nitrofurantoin Rash    Other reaction(s): Other (See Comments) DIFFICULTY BREATHING and rash    Penicillins Rash   Sulfa Antibiotics Rash    Patient Active Problem List   Diagnosis Date Noted   TIA (transient ischemic attack) 12/02/2020   CVA (cerebral vascular accident) (HCC) 12/01/2020   Chest pain 12/01/2020   Controlled type 2 diabetes mellitus with complication, without long-term current use of insulin (HCC) 12/05/2016   H/O cerebral aneurysm repair 11/28/2016   Anxiety state 09/21/2016   OSA on CPAP 11/23/2015   Coronary artery disease involving native coronary artery of native heart with angina pectoris (HCC) 05/23/2014   Essential hypertension 07/07/2013     Past Medical History:  Diagnosis Date   Arthritis    B12 deficiency    Brain aneurysm    Cancer (HCC)    SKIN-non-melanoma   Coronary artery disease    Depression    Dyspnea  DOE   Dysrhythmia    GERD (gastroesophageal reflux disease)    Hammer toe of right foot    second toe   Heart murmur    Hypertension    Memory loss    Osteoporosis    Sleep apnea      Past Surgical History:  Procedure Laterality Date   ABDOMINAL HYSTERECTOMY  1972   APPENDECTOMY     BLADDER SURGERY  2011    BRAIN SURGERY     ANEURTSM WITH COIL   BREAST SURGERY Left 2002   LUMPECTOMY   CATARACT EXTRACTION W/PHACO Right 11/21/2017   Procedure: CATARACT EXTRACTION PHACO AND INTRAOCULAR LENS PLACEMENT (IOC);  Surgeon: Clair Crews, MD;  Location: [facility] ORS;  Service: Ophthalmology;  Laterality: Right;  US  00:41 CDE 5.29 Fluid pack lot # [id number]   CATARACT EXTRACTION W/PHACO Left 12/19/2017   Procedure: CATARACT EXTRACTION PHACO AND INTRAOCULAR LENS PLACEMENT (IOC);  Surgeon: Clair Crews, MD;  Location: [facility] ORS;  Service: Ophthalmology;  Laterality: Left;  US  00:33 CDE 3.94 Fluid pack lot # [id number]   CHOLECYSTECTOMY     FOOT SURGERY     HEMORROIDECTOMY     RCR  2006   SALPINGOOPHORECTOMY     TONSILLECTOMY      Social History   Socioeconomic History   Marital status: Married    Spouse name: Not on file   Number of children: Not on file   Years of education: Not on file   Highest education level: Not on file  Occupational History   Not on file  Tobacco Use   Smoking status: Never   Smokeless tobacco: Never  Vaping Use   Vaping status: Never Used  Substance and Sexual Activity   Alcohol use: Not Currently   Drug use: Never   Sexual activity: Not on file  Other Topics Concern   Not on file  Social History Narrative   Not on file   Social Drivers of Health   Financial Resource Strain: Low Risk  (10/27/2022)   Received from [facility]   Overall Financial Resource Strain (CARDIA)    Difficulty of Paying Living Expenses: Not hard at all  Food Insecurity: No Food Insecurity (10/27/2022)   Received from [facility]   Hunger Vital Sign    Within the past 12 months, you worried that your food would run out before you got the money to buy more.: Never true    Within the past 12 months, the food you bought just didn't last and you didn't have money to get more.: Never true  Transportation Needs: No Transportation Needs (10/27/2022)    Received from [facility] - Transportation    In the past 12 months, has lack of transportation kept you from medical appointments or from getting medications?: No    Lack of Transportation (Non-Medical): No  Physical Activity: Not on file  Stress: Not on file  Social Connections: Not on file  Intimate Partner Violence: Not on file     Family History  Problem Relation Age of Onset   Glaucoma Mother    Heart disease Mother    Heart disease Father    Migraines Father    Cancer Father    Heart disease Sister    Hypertension Sister    Diabetes Sister    Diabetes Brother      Current Outpatient Medications:    acetaminophen  (TYLENOL ) 325 MG tablet, Take 650 mg by mouth every 6 (  six) hours as needed., Disp: , Rfl:    citalopram  (CELEXA ) 10 MG tablet, Take 1 tablet by mouth daily., Disp: , Rfl:    donepezil  (ARICEPT ) 10 MG tablet, Take 10 mg by mouth at bedtime., Disp: , Rfl:    fluticasone (FLONASE) 50 MCG/ACT nasal spray, Place into both nostrils daily., Disp: , Rfl:    lovastatin (MEVACOR) 20 MG tablet, Take 20 mg by mouth at bedtime., Disp: , Rfl:    metoprolol  succinate (TOPROL -XL) 25 MG 24 hr tablet, Take 25 mg by mouth daily., Disp: , Rfl:    nitroGLYCERIN  (NITROSTAT ) 0.4 MG SL tablet, Place 0.4 mg under the tongue every 5 (five) minutes as needed for chest pain., Disp: , Rfl:    pantoprazole  (PROTONIX ) 40 MG tablet, Take 1 tablet (40 mg total) by mouth daily., Disp: 30 tablet, Rfl: 0   Physical exam:  Vitals:   07/21/23 1429  BP: 113/62  Pulse: 84  Resp: 18  Temp: 99 F (37.2 C)  TempSrc: Tympanic  SpO2: 95%  Weight: 153 lb 9.6 oz (69.7 kg)  Height: 5' 2 (1.575 m)   Physical Exam  Cardiovascular:     Rate and Rhythm: Normal rate and regular rhythm.     Heart sounds: Normal heart sounds.  Pulmonary:     Effort: Pulmonary effort is normal.     Breath sounds: Normal breath sounds.  Abdominal:     General: Bowel sounds are normal.      Palpations: Abdomen is soft.     Comments: No palpable hepatosplenomegaly   Skin:    General: Skin is warm and dry.   Neurological:     Mental Status: She is alert and oriented to person, place, and time.           Latest Ref Rng & Units 07/16/2023    9:53 PM  CMP  Glucose 70 - 99 mg/dL 161   BUN 8 - 23 mg/dL 9   Creatinine 0.96 - 0.45 mg/dL 4.09   Sodium 811 - 914 mmol/L 140   Potassium 3.5 - 5.1 mmol/L 3.9   Chloride 98 - 111 mmol/L 105   CO2 22 - 32 mmol/L 24   Calcium 8.9 - 10.3 mg/dL 9.1   Total Protein 6.5 - 8.1 g/dL 7.2   Total Bilirubin 0.0 - 1.2 mg/dL 1.7   Alkaline Phos 38 - 126 U/L 54   AST 15 - 41 U/L 40   ALT 0 - 44 U/L 29       Latest Ref Rng & Units 07/16/2023    9:53 PM  CBC  WBC 4.0 - 10.5 K/uL 8.5   Hemoglobin 12.0 - 15.0 g/dL 78.2   Hematocrit 95.6 - 46.0 % 37.6   Platelets 150 - 400 K/uL 926     No images are attached to the encounter.  DG Knee Complete 4 Views Right Result Date: 07/16/2023 CLINICAL DATA:  fall, medial pain EXAM: RIGHT KNEE - COMPLETE 4+ VIEW COMPARISON:  X-ray knee 03/26/2022 FINDINGS: No evidence of fracture, dislocation, or joint effusion. Recommend or moderate degenerative changes of the knee. No aggressive appearing focal bone abnormality. Soft tissues are unremarkable. IMPRESSION: No acute displaced fracture or dislocation. Electronically Signed   By: Morgane  Naveau M.D.   On: 07/16/2023 22:38   CT HEAD WO CONTRAST ( ) Result Date: 07/16/2023 EXAM: CT HEAD WITHOUT CONTRAST 07/16/2023 10:14:12 PM TECHNIQUE: CT of the head was performed without the administration of intravenous contrast. Automated exposure control, iterative reconstruction, and/or  weight based adjustment of the mA/kV was utilized to reduce the radiation dose to as low as reasonably achievable. COMPARISON: 03/26/2022 CLINICAL HISTORY: Fall, left forehead trauma. Patient found on floor by family after fall last night. Complains of right knee pain, describing pain  as feels numb. Denies blood thinner use. Does not remember what caused her to fall. FINDINGS: BRAIN AND VENTRICLES: There is no acute intracranial hemorrhage, mass effect or midline shift. No abnormal extra-axial fluid collection. The gray-white differentiation is maintained without an acute infarct. There is no hydrocephalus. Global cortical atrophy. Subcortical and periventricular small vessel ischemic changes. ORBITS: The visualized portion of the orbits demonstrate no acute abnormality. SINUSES: The visualized paranasal sinuses and mastoid air cells demonstrate no acute abnormality. SOFT TISSUES AND SKULL: No acute abnormality of the visualized skull or soft tissues. VASCULATURE: Right intracranial ICA stent. IMPRESSION: 1. No acute intracranial abnormality. Atrophy with small vessel ischemic changes. 2. No traumatic injury to the cervical spine. Mild degenerative changes. Electronically signed by: Zadie Herter MD 07/16/2023 10:22 PM EDT RP Workstation: [id number]   CT Cervical Spine Wo Contrast Result Date: 07/16/2023 EXAM: CT HEAD WITHOUT CONTRAST 07/16/2023 10:14:12 PM TECHNIQUE: CT of the head was performed without the administration of intravenous contrast. Automated exposure control, iterative reconstruction, and/or weight based adjustment of the mA/kV was utilized to reduce the radiation dose to as low as reasonably achievable. COMPARISON: 03/26/2022 CLINICAL HISTORY: Fall, left forehead trauma. Patient found on floor by family after fall last night. Complains of right knee pain, describing pain as feels numb. Denies blood thinner use. Does not remember what caused her to fall. FINDINGS: BRAIN AND VENTRICLES: There is no acute intracranial hemorrhage, mass effect or midline shift. No abnormal extra-axial fluid collection. The gray-white differentiation is maintained without an acute infarct. There is no hydrocephalus. Global cortical atrophy. Subcortical and periventricular small vessel  ischemic changes. ORBITS: The visualized portion of the orbits demonstrate no acute abnormality. SINUSES: The visualized paranasal sinuses and mastoid air cells demonstrate no acute abnormality. SOFT TISSUES AND SKULL: No acute abnormality of the visualized skull or soft tissues. VASCULATURE: Right intracranial ICA stent. IMPRESSION: 1. No acute intracranial abnormality. Atrophy with small vessel ischemic changes. 2. No traumatic injury to the cervical spine. Mild degenerative changes. Electronically signed by: Zadie Herter MD 07/16/2023 10:22 PM EDT RP Workstation: [id number]    Assessment and plan- Patient is a 83 y.o. female referred for thrombocytosis  Patient is here with her granddaughter today and I discussed differences between primary thrombocytosis which can be seen with myeloproliferative neoplasm versus reactive thrombocytosis.  Given the fact that patient's platelet count was as high as 926 this is concerning for myeloproliferative process.  Discussed that there are broadly for conditions that fall under myeloproliferative neoplasm: Polycythemia vera which the patient does not have as she has normal hemoglobin, chronic myeloid leukemia which we will rule out with BCR-ABL FISH testing, essential thrombocytosis and primary myelofibrosis.  For further workup of primary myeloproliferative neoplasm I will be checking JAK2 with reflex to CALR and MPL mutation.  Also checking for von Willebrand panel before considering starting her on aspirin .  Ruling out other causes of secondary thrombocytosis with ferritin and iron studies and ESR although that seems less likely given how high her platelet counts are.  I will see her back on 08/01/2023 to discuss the results of today's blood work.  She may land up needing a bone marrow biopsy as well in the future.  Discussed that thrombocytosis associated with  myeloproliferative neoplasms are associated with increased risk of thrombosis and therefore cytoreductive  agent such as Hydrea are warranted in these cases to keep patient's platelet counts less than 400.  Discussed acetaminophens of Hydrea including all but not limited to possible cytopenias, mouth sores and leg ulcers, dizziness nausea vomiting and diarrhea.  Patient understands and agrees to proceed as planned.  If her platelet count still comes back greater than 600 I will empirically put her on Hydrea while this workup is ongoing.  I will decide about putting her on aspirin  after results of von Willebrand panel testing are back.  Patient and her granddaughter comprehend my plan well.   Thank you for this kind referral and the opportunity to participate in the care of this patient   Visit Diagnosis 1. Thrombocytosis     Dr. Seretha Dance, MD, MPH [facility] [phone] 07/21/2023

## 2023-07-21 NOTE — Telephone Encounter (Signed)
 Per Dr. Randy Buttery Please let patient or her granddaughter know that her platelets still came back at high value of 820. I have sent Hydrea prescription to her pharmacy 500 mg daily which she can start taking tomorrow. I have discussed all this with them in my clinic today.  Outbound call; spoke to caregiver / granddaughter Kathaleen Pale, informed of above. Caregiver verbalized understanding; no additional questions / concerns at this time.

## 2023-07-27 ENCOUNTER — Telehealth: Payer: Self-pay

## 2023-07-27 ENCOUNTER — Ambulatory Visit: Payer: Self-pay | Admitting: Oncology

## 2023-07-27 LAB — JAK2 V617F RFX CALR/MPL/E12-15: JAK2 V617F %: 13.82 %

## 2023-07-27 LAB — VON WILLEBRAND PANEL
Coagulation Factor VIII: 197 % — ABNORMAL HIGH (ref 56–140)
Ristocetin Co-factor, Plasma: 134 % (ref 50–200)
Von Willebrand Antigen, Plasma: 180 % (ref 50–200)

## 2023-07-27 LAB — COAG STUDIES INTERP REPORT

## 2023-07-27 NOTE — Telephone Encounter (Signed)
 Documentation for SDOH Screenings.

## 2023-07-28 LAB — BCR-ABL1 FISH
Cells Analyzed: 200
Cells Counted: 200

## 2023-07-28 NOTE — Telephone Encounter (Signed)
-----   Message from Annah JAYSON Skene sent at 07/27/2023  6:16 PM EDT ----- Please have her start baby aspirin  81 mg and hydrea  500 mg daily. This is because she is JAK2 positive and we want to decrease her platelet count to <400. Will discuss further on 7/1 ----- Message ----- From: Rebecka, Lab In [place] Sent: 07/21/2023   3:30 PM EDT To: Annah JAYSON Skene, MD

## 2023-07-28 NOTE — Telephone Encounter (Signed)
 Per Dr. Melanee Please have her start baby aspirin  81 mg and hydrea  500 mg daily. This is because she is JAK2 positive and we want to decrease her platelet count to <400. Will discuss further on 7/1.  Outbound call; spoke to granddaughter Bari, informed of above.   Caregiver states patient is tolerating Hydrea  well; usually eats before taking medication.  No other questions / concerns at this time.

## 2023-08-01 ENCOUNTER — Ambulatory Visit: Payer: Self-pay | Admitting: Oncology

## 2023-08-01 ENCOUNTER — Encounter: Payer: Self-pay | Admitting: Oncology

## 2023-08-01 ENCOUNTER — Inpatient Hospital Stay

## 2023-08-01 ENCOUNTER — Inpatient Hospital Stay: Attending: Oncology | Admitting: Oncology

## 2023-08-01 VITALS — BP 126/74 | HR 71 | Temp 98.0°F | Resp 15 | Wt 153.0 lb

## 2023-08-01 DIAGNOSIS — Z8673 Personal history of transient ischemic attack (TIA), and cerebral infarction without residual deficits: Secondary | ICD-10-CM | POA: Diagnosis not present

## 2023-08-01 DIAGNOSIS — Z83511 Family history of glaucoma: Secondary | ICD-10-CM | POA: Diagnosis not present

## 2023-08-01 DIAGNOSIS — D473 Essential (hemorrhagic) thrombocythemia: Secondary | ICD-10-CM | POA: Insufficient documentation

## 2023-08-01 DIAGNOSIS — Z8249 Family history of ischemic heart disease and other diseases of the circulatory system: Secondary | ICD-10-CM | POA: Diagnosis not present

## 2023-08-01 DIAGNOSIS — Z881 Allergy status to other antibiotic agents status: Secondary | ICD-10-CM | POA: Insufficient documentation

## 2023-08-01 DIAGNOSIS — Z887 Allergy status to serum and vaccine status: Secondary | ICD-10-CM | POA: Diagnosis not present

## 2023-08-01 DIAGNOSIS — Z90721 Acquired absence of ovaries, unilateral: Secondary | ICD-10-CM | POA: Insufficient documentation

## 2023-08-01 DIAGNOSIS — Z880 Allergy status to penicillin: Secondary | ICD-10-CM | POA: Insufficient documentation

## 2023-08-01 DIAGNOSIS — Z882 Allergy status to sulfonamides status: Secondary | ICD-10-CM | POA: Diagnosis not present

## 2023-08-01 DIAGNOSIS — Z809 Family history of malignant neoplasm, unspecified: Secondary | ICD-10-CM | POA: Diagnosis not present

## 2023-08-01 DIAGNOSIS — Z90710 Acquired absence of both cervix and uterus: Secondary | ICD-10-CM | POA: Insufficient documentation

## 2023-08-01 DIAGNOSIS — Z79899 Other long term (current) drug therapy: Secondary | ICD-10-CM | POA: Diagnosis not present

## 2023-08-01 DIAGNOSIS — Z86718 Personal history of other venous thrombosis and embolism: Secondary | ICD-10-CM | POA: Insufficient documentation

## 2023-08-01 DIAGNOSIS — Z833 Family history of diabetes mellitus: Secondary | ICD-10-CM | POA: Insufficient documentation

## 2023-08-01 DIAGNOSIS — Z9049 Acquired absence of other specified parts of digestive tract: Secondary | ICD-10-CM | POA: Insufficient documentation

## 2023-08-01 DIAGNOSIS — D75839 Thrombocytosis, unspecified: Secondary | ICD-10-CM

## 2023-08-01 LAB — CBC WITH DIFFERENTIAL (CANCER CENTER ONLY)
Abs Immature Granulocytes: 0.09 10*3/uL — ABNORMAL HIGH (ref 0.00–0.07)
Basophils Absolute: 0.2 10*3/uL — ABNORMAL HIGH (ref 0.0–0.1)
Basophils Relative: 3 %
Eosinophils Absolute: 0.3 10*3/uL (ref 0.0–0.5)
Eosinophils Relative: 5 %
HCT: 31.9 % — ABNORMAL LOW (ref 36.0–46.0)
Hemoglobin: 10.2 g/dL — ABNORMAL LOW (ref 12.0–15.0)
Immature Granulocytes: 1 %
Lymphocytes Relative: 27 %
Lymphs Abs: 1.7 10*3/uL (ref 0.7–4.0)
MCH: 31.5 pg (ref 26.0–34.0)
MCHC: 32 g/dL (ref 30.0–36.0)
MCV: 98.5 fL (ref 80.0–100.0)
Monocytes Absolute: 0.7 10*3/uL (ref 0.1–1.0)
Monocytes Relative: 11 %
Neutro Abs: 3.4 10*3/uL (ref 1.7–7.7)
Neutrophils Relative %: 53 %
Platelet Count: 761 10*3/uL — ABNORMAL HIGH (ref 150–400)
RBC: 3.24 MIL/uL — ABNORMAL LOW (ref 3.87–5.11)
RDW: 19.4 % — ABNORMAL HIGH (ref 11.5–15.5)
WBC Count: 6.4 10*3/uL (ref 4.0–10.5)
nRBC: 0.3 % — ABNORMAL HIGH (ref 0.0–0.2)

## 2023-08-01 MED ORDER — HYDROXYUREA 500 MG PO CAPS: ORAL_CAPSULE | ORAL | 1 refills | Status: AC

## 2023-08-01 NOTE — Telephone Encounter (Signed)
-----   Message from Meghan Bell sent at 08/01/2023 11:13 AM EDT ----- Please have her take hydrea  1000 mg twice a week and 500 mg on other days ----- Message ----- From: Rebecka, Lab In [place] Sent: 08/01/2023  11:11 AM EDT To: Meghan JAYSON Skene, MD

## 2023-08-01 NOTE — Progress Notes (Signed)
 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  She presented to the ER on 07/16/2023 following a fall and incidentally CBC showed white count of 8.5, H&H of 12.1/37.6 and a platelet count of 926.  In February 2024 her platelet count was 404.  Patient was previously on Plavix  up until September 2024 but each time she started Plavix  for her TIA she began to experience hematuria.  She had a comprehensive hematuria workup which was negative for any acute pathology.  She has therefore remained off Plavix  since September 2024.  She is not presently on any blood thinners.  She lives alone and is independent of her ADLs.   Results of blood work from 20 2025 showed white count of 9.7, H&H of 11/33.6 with a platelet count of 821.  JAK2 mutation testing positive.  BCR-ABL FISH negative.  Von Willebrand panel testing normal.  Ferritin levels low at 28 with an iron saturation of 18% and elevated TIBC of 466.  Patient started Hydrea  500 mg daily in mid June 2025.  Interval history-patient has been on Hydrea  for about 2 weeks now and is tolerating it well without any significant dizziness or mouth sores.  Denies any nausea vomiting or diarrhea.  She is also taking low-dose aspirin .  Reports no new complaints today.  ECOG PS- 1 Pain scale- 0   Review of systems- Review of Systems  Constitutional:  Negative for chills, fever,  malaise/fatigue and weight loss.  HENT:  Negative for congestion, ear discharge and nosebleeds.   Eyes:  Negative for blurred vision.  Respiratory:  Negative for cough, hemoptysis, sputum production, shortness of breath and wheezing.   Cardiovascular:  Negative for chest pain, palpitations, orthopnea and claudication.  Gastrointestinal:  Negative for abdominal pain, blood in stool, constipation, diarrhea, heartburn, melena, nausea and vomiting.  Genitourinary:  Negative for dysuria, flank pain, frequency, hematuria and urgency.  Musculoskeletal:  Negative for back pain, joint pain and myalgias.  Skin:  Negative for rash.  Neurological:  Negative for dizziness, tingling, focal weakness, seizures, weakness and headaches.  Endo/Heme/Allergies:  Does not bruise/bleed easily.  Psychiatric/Behavioral:  Negative for depression and suicidal ideas. The patient does not have insomnia.       Allergies  Allergen Reactions   Amoxicillin Other (See Comments)    Other reaction(s): Other (See Comments) Other Reaction: GI Upset Other Reaction: GI Upset    Erythromycin Shortness Of Breath and Rash   Influenza Vaccines Rash and Shortness Of Breath   Bacitracin Dermatitis   Elemental Sulfur    Other     Other reaction(s): Other (See Comments), Other (See Comments) Crestor, Lipitor, Pravachol , Zocor   - myalgias Crestor, Lipitor, Pravachol , Zocor   - myalgias    Nitrofurantoin Rash    Other reaction(s): Other (See Comments) DIFFICULTY BREATHING and rash    Penicillins Rash   Sulfa Antibiotics Rash     Past Medical History:  Diagnosis Date   Arthritis    B12 deficiency  Brain aneurysm    Cancer (HCC)    SKIN-non-melanoma   Coronary artery disease    Depression    Dyspnea    DOE   Dysrhythmia    GERD (gastroesophageal reflux disease)    Hammer toe of right foot    second toe   Heart murmur    Hypertension    Memory loss    Osteoporosis    Sleep apnea      Past Surgical  History:  Procedure Laterality Date   ABDOMINAL HYSTERECTOMY  1972   APPENDECTOMY     BLADDER SURGERY  2011   BRAIN SURGERY     ANEURTSM WITH COIL   BREAST SURGERY Left 2002   LUMPECTOMY   CATARACT EXTRACTION W/PHACO Right 11/21/2017   Procedure: CATARACT EXTRACTION PHACO AND INTRAOCULAR LENS PLACEMENT (IOC);  Surgeon: Jaye Fallow, MD;  Location: [facility] ORS;  Service: Ophthalmology;  Laterality: Right;  US  00:41 CDE 5.29 Fluid pack lot # [id number]   CATARACT EXTRACTION W/PHACO Left 12/19/2017   Procedure: CATARACT EXTRACTION PHACO AND INTRAOCULAR LENS PLACEMENT (IOC);  Surgeon: Jaye Fallow, MD;  Location: [facility] ORS;  Service: Ophthalmology;  Laterality: Left;  US  00:33 CDE 3.94 Fluid pack lot # [id number]   CHOLECYSTECTOMY     FOOT SURGERY     HEMORROIDECTOMY     RCR  2006   SALPINGOOPHORECTOMY     TONSILLECTOMY      Social History   Socioeconomic History   Marital status: Married    Spouse name: Not on file   Number of children: Not on file   Years of education: Not on file   Highest education level: Not on file  Occupational History   Not on file  Tobacco Use   Smoking status: Never   Smokeless tobacco: Never  Vaping Use   Vaping status: Never Used  Substance and Sexual Activity   Alcohol use: Not Currently   Drug use: Never   Sexual activity: Not on file  Other Topics Concern   Not on file  Social History Narrative   Not on file   Social Drivers of Health   Financial Resource Strain: Low Risk  (10/27/2022)   Received from [facility]   Overall Financial Resource Strain (CARDIA)    Difficulty of Paying Living Expenses: Not hard at all  Food Insecurity: No Food Insecurity (07/27/2023)   Hunger Vital Sign    Worried About Running Out of Food in the Last Year: Never true    Ran Out of Food in the Last Year: Never true  Transportation Needs: No Transportation Needs (07/27/2023)   PRAPARE - Administrator, Civil Service  (Medical): No    Lack of Transportation (Non-Medical): No  Physical Activity: Not on file  Stress: Not on file  Social Connections: Not on file  Intimate Partner Violence: Not At Risk (07/27/2023)   Humiliation, Afraid, Rape, and Kick questionnaire    Fear of Current or Ex-Partner: No    Emotionally Abused: No    Physically Abused: No    Sexually Abused: No    Family History  Problem Relation Age of Onset   Glaucoma Mother    Heart disease Mother    Heart disease Father    Migraines Father    Cancer Father    Heart disease Sister    Hypertension Sister    Diabetes Sister    Diabetes Brother      Current Outpatient Medications:    acetaminophen  (TYLENOL ) 325  MG tablet, Take 650 mg by mouth every 6 (six) hours as needed., Disp: , Rfl:    Aspirin  81 MG CAPS, , Disp: , Rfl:    citalopram  (CELEXA ) 10 MG tablet, Take 1 tablet by mouth daily., Disp: , Rfl:    donepezil  (ARICEPT ) 10 MG tablet, Take 10 mg by mouth at bedtime., Disp: , Rfl:    hydroxyurea  (HYDREA ) 500 MG capsule, Take 1 capsule (500 mg total) by mouth daily. May take with food to minimize GI side effects., Disp: 30 capsule, Rfl: 0   lovastatin (MEVACOR) 20 MG tablet, Take 20 mg by mouth at bedtime., Disp: , Rfl:    metoprolol  succinate (TOPROL -XL) 25 MG 24 hr tablet, Take 25 mg by mouth daily., Disp: , Rfl:    pantoprazole  (PROTONIX ) 40 MG tablet, Take 1 tablet (40 mg total) by mouth daily., Disp: 30 tablet, Rfl: 0   fluticasone (FLONASE) 50 MCG/ACT nasal spray, Place into both nostrils daily., Disp: , Rfl:    nitroGLYCERIN  (NITROSTAT ) 0.4 MG SL tablet, Place 0.4 mg under the tongue every 5 (five) minutes as needed for chest pain., Disp: , Rfl:   Physical exam:  Vitals:   08/01/23 1010  BP: 126/74  Pulse: 71  Resp: 15  Temp: 98 F (36.7 C)  TempSrc: Tympanic  SpO2: 93%  Weight: 153 lb (69.4 kg)   Physical Exam  Cardiovascular:     Rate and Rhythm: Normal rate and regular rhythm.     Heart sounds: Normal  heart sounds.  Pulmonary:     Effort: Pulmonary effort is normal.     Breath sounds: Normal breath sounds.  Abdominal:     General: Bowel sounds are normal.   Skin:    General: Skin is warm and dry.   Neurological:     Mental Status: She is alert and oriented to person, place, and time.      I have personally reviewed labs listed below:    Latest Ref Rng & Units 07/16/2023    9:53 PM  CMP  Glucose 70 - 99 mg/dL 866   BUN 8 - 23 mg/dL 9   Creatinine 9.55 - 8.99 mg/dL 9.46   Sodium 864 - 854 mmol/L 140   Potassium 3.5 - 5.1 mmol/L 3.9   Chloride 98 - 111 mmol/L 105   CO2 22 - 32 mmol/L 24   Calcium 8.9 - 10.3 mg/dL 9.1   Total Protein 6.5 - 8.1 g/dL 7.2   Total Bilirubin 0.0 - 1.2 mg/dL 1.7   Alkaline Phos 38 - 126 U/L 54   AST 15 - 41 U/L 40   ALT 0 - 44 U/L 29       Latest Ref Rng & Units 08/01/2023   11:04 AM  CBC  WBC 4.0 - 10.5 K/uL 6.4   Hemoglobin 12.0 - 15.0 g/dL 89.7   Hematocrit 63.9 - 46.0 % 31.9   Platelets 150 - 400 K/uL 761    I have personally reviewed Radiology images listed below: No images are attached to the encounter.  DG Knee Complete 4 Views Right Result Date: 07/16/2023 CLINICAL DATA:  fall, medial pain EXAM: RIGHT KNEE - COMPLETE 4+ VIEW COMPARISON:  X-ray knee 03/26/2022 FINDINGS: No evidence of fracture, dislocation, or joint effusion. Recommend or moderate degenerative changes of the knee. No aggressive appearing focal bone abnormality. Soft tissues are unremarkable. IMPRESSION: No acute displaced fracture or dislocation. Electronically Signed   By: Morgane  Naveau M.D.   On: 07/16/2023 22:38  CT HEAD WO CONTRAST ( ) Result Date: 07/16/2023 EXAM: CT HEAD WITHOUT CONTRAST 07/16/2023 10:14:12 PM TECHNIQUE: CT of the head was performed without the administration of intravenous contrast. Automated exposure control, iterative reconstruction, and/or weight based adjustment of the mA/kV was utilized to reduce the radiation dose to as low as  reasonably achievable. COMPARISON: 03/26/2022 CLINICAL HISTORY: Fall, left forehead trauma. Patient found on floor by family after fall last night. Complains of right knee pain, describing pain as feels numb. Denies blood thinner use. Does not remember what caused her to fall. FINDINGS: BRAIN AND VENTRICLES: There is no acute intracranial hemorrhage, mass effect or midline shift. No abnormal extra-axial fluid collection. The gray-white differentiation is maintained without an acute infarct. There is no hydrocephalus. Global cortical atrophy. Subcortical and periventricular small vessel ischemic changes. ORBITS: The visualized portion of the orbits demonstrate no acute abnormality. SINUSES: The visualized paranasal sinuses and mastoid air cells demonstrate no acute abnormality. SOFT TISSUES AND SKULL: No acute abnormality of the visualized skull or soft tissues. VASCULATURE: Right intracranial ICA stent. IMPRESSION: 1. No acute intracranial abnormality. Atrophy with small vessel ischemic changes. 2. No traumatic injury to the cervical spine. Mild degenerative changes. Electronically signed by: Pinkie Pebbles MD 07/16/2023 10:22 PM EDT RP Workstation: [id number]   CT Cervical Spine Wo Contrast Result Date: 07/16/2023 EXAM: CT HEAD WITHOUT CONTRAST 07/16/2023 10:14:12 PM TECHNIQUE: CT of the head was performed without the administration of intravenous contrast. Automated exposure control, iterative reconstruction, and/or weight based adjustment of the mA/kV was utilized to reduce the radiation dose to as low as reasonably achievable. COMPARISON: 03/26/2022 CLINICAL HISTORY: Fall, left forehead trauma. Patient found on floor by family after fall last night. Complains of right knee pain, describing pain as feels numb. Denies blood thinner use. Does not remember what caused her to fall. FINDINGS: BRAIN AND VENTRICLES: There is no acute intracranial hemorrhage, mass effect or midline shift. No abnormal extra-axial  fluid collection. The gray-white differentiation is maintained without an acute infarct. There is no hydrocephalus. Global cortical atrophy. Subcortical and periventricular small vessel ischemic changes. ORBITS: The visualized portion of the orbits demonstrate no acute abnormality. SINUSES: The visualized paranasal sinuses and mastoid air cells demonstrate no acute abnormality. SOFT TISSUES AND SKULL: No acute abnormality of the visualized skull or soft tissues. VASCULATURE: Right intracranial ICA stent. IMPRESSION: 1. No acute intracranial abnormality. Atrophy with small vessel ischemic changes. 2. No traumatic injury to the cervical spine. Mild degenerative changes. Electronically signed by: Pinkie Pebbles MD 07/16/2023 10:22 PM EDT RP Workstation: [id number]     Assessment and plan- Patient is a 83 y.o. 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  Patient has evidence of JAK2 positivity and thrombocytosis.  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  BCR-ABL testing negative and she does not meet criteria for polycythemia vera.  She could have a fall either in the category of essential thrombocytosis or primary myelofibrosis.  Due to her age I am deferring bone marrow biopsy at this time and I will treat her as such presumptively for essential thrombocytosis with Hydrea .  She started taking 500 mg daily Hydrea  about 2 weeks ago and platelets today are 761.  I will have her increase Hydrea  to 1000 mg 2 times a week and 500 mg on other days.  Goal is to bring her platelet counts to less than 400.  She will also continue with  low-dose aspirin  81 mg at this time.  She does follow in the high risk group due to age > Than 60 and prior history of TIA as well.  In a phase 3 trial of patients with high-risk ET, compared with no cytoreductive treatment, hydroxyurea  reduced arterial and  venous thrombotic events. The 114 patients were randomly assigned to hydroxyurea  versus no hydroxyurea , and with a median follow-up of 27 months, there were fewer thrombotic events in those who received hydroxyurea  (4 versus 24 percent). The untreated patients had platelet counts from 892,000/microL to 986,000/microL at six months, compared with <600,000 platelets/microL in all patients treated with hydroxyurea .  Multivariable analysis of 891 patients with ET reported that age >60 years, history of thrombosis, elevated white blood cell (WBC) count, and anemia were associated with survival. Overall survival (OS) at 10 years was 89 percent, and the 15-year OS was 80 percent.  In a retrospective study of 435 patients with ET, the 15-year cumulative risks of thrombosis, AML, and post-ET MF were 17, 2, and 4 percent, respectively    CBC with differential today and on a monthly basis I will see her back in 3 months   Visit Diagnosis 1. Essential thrombocytosis (HCC)   2. High risk medication use      Dr. Annah Skene, MD, MPH [facility] [phone] 08/01/2023 12:33 PM

## 2023-08-01 NOTE — Telephone Encounter (Addendum)
 Per Dr. Melanee Please have her take hydrea  1000 mg twice a week and 500 mg on other days.  Outbound call to caregiver / daughter Bari; informed of above.  Sent a new script with updated dosing.  Caregiver has no additional questions / concerns.

## 2023-09-01 ENCOUNTER — Inpatient Hospital Stay: Attending: Oncology

## 2023-09-01 DIAGNOSIS — Z79899 Other long term (current) drug therapy: Secondary | ICD-10-CM | POA: Diagnosis not present

## 2023-09-01 DIAGNOSIS — D473 Essential (hemorrhagic) thrombocythemia: Secondary | ICD-10-CM | POA: Insufficient documentation

## 2023-09-01 LAB — CBC WITH DIFFERENTIAL (CANCER CENTER ONLY)
Abs Immature Granulocytes: 0.03 K/uL (ref 0.00–0.07)
Basophils Absolute: 0.1 K/uL (ref 0.0–0.1)
Basophils Relative: 2 %
Eosinophils Absolute: 0.1 K/uL (ref 0.0–0.5)
Eosinophils Relative: 2 %
HCT: 36 % (ref 36.0–46.0)
Hemoglobin: 11.9 g/dL — ABNORMAL LOW (ref 12.0–15.0)
Immature Granulocytes: 1 %
Lymphocytes Relative: 27 %
Lymphs Abs: 1.4 K/uL (ref 0.7–4.0)
MCH: 34.9 pg — ABNORMAL HIGH (ref 26.0–34.0)
MCHC: 33.1 g/dL (ref 30.0–36.0)
MCV: 105.6 fL — ABNORMAL HIGH (ref 80.0–100.0)
Monocytes Absolute: 0.4 K/uL (ref 0.1–1.0)
Monocytes Relative: 8 %
Neutro Abs: 3.3 K/uL (ref 1.7–7.7)
Neutrophils Relative %: 60 %
Platelet Count: 420 K/uL — ABNORMAL HIGH (ref 150–400)
RBC: 3.41 MIL/uL — ABNORMAL LOW (ref 3.87–5.11)
RDW: 23.2 % — ABNORMAL HIGH (ref 11.5–15.5)
WBC Count: 5.4 K/uL (ref 4.0–10.5)
nRBC: 0 % (ref 0.0–0.2)

## 2023-10-03 ENCOUNTER — Inpatient Hospital Stay: Attending: Oncology

## 2023-10-03 DIAGNOSIS — D473 Essential (hemorrhagic) thrombocythemia: Secondary | ICD-10-CM | POA: Insufficient documentation

## 2023-10-03 DIAGNOSIS — Z79899 Other long term (current) drug therapy: Secondary | ICD-10-CM | POA: Diagnosis not present

## 2023-10-03 LAB — IRON AND TIBC
Iron: 144 ug/dL (ref 28–170)
Saturation Ratios: 35 % — ABNORMAL HIGH (ref 10.4–31.8)
TIBC: 412 ug/dL (ref 250–450)
UIBC: 268 ug/dL

## 2023-10-03 LAB — CBC WITH DIFFERENTIAL (CANCER CENTER ONLY)
Abs Immature Granulocytes: 0.01 K/uL (ref 0.00–0.07)
Basophils Absolute: 0.1 K/uL (ref 0.0–0.1)
Basophils Relative: 2 %
Eosinophils Absolute: 0.4 K/uL (ref 0.0–0.5)
Eosinophils Relative: 8 %
HCT: 38.9 % (ref 36.0–46.0)
Hemoglobin: 13.2 g/dL (ref 12.0–15.0)
Immature Granulocytes: 0 %
Lymphocytes Relative: 44 %
Lymphs Abs: 2 K/uL (ref 0.7–4.0)
MCH: 36.9 pg — ABNORMAL HIGH (ref 26.0–34.0)
MCHC: 33.9 g/dL (ref 30.0–36.0)
MCV: 108.7 fL — ABNORMAL HIGH (ref 80.0–100.0)
Monocytes Absolute: 0.3 K/uL (ref 0.1–1.0)
Monocytes Relative: 7 %
Neutro Abs: 1.8 K/uL (ref 1.7–7.7)
Neutrophils Relative %: 39 %
Platelet Count: 178 K/uL (ref 150–400)
RBC: 3.58 MIL/uL — ABNORMAL LOW (ref 3.87–5.11)
RDW: 18.4 % — ABNORMAL HIGH (ref 11.5–15.5)
WBC Count: 4.5 K/uL (ref 4.0–10.5)
nRBC: 0 % (ref 0.0–0.2)

## 2023-10-03 LAB — CMP (CANCER CENTER ONLY)
ALT: 19 U/L (ref 0–44)
AST: 24 U/L (ref 15–41)
Albumin: 4 g/dL (ref 3.5–5.0)
Alkaline Phosphatase: 58 U/L (ref 38–126)
Anion gap: 8 (ref 5–15)
BUN: 11 mg/dL (ref 8–23)
CO2: 25 mmol/L (ref 22–32)
Calcium: 8.9 mg/dL (ref 8.9–10.3)
Chloride: 102 mmol/L (ref 98–111)
Creatinine: 0.54 mg/dL (ref 0.44–1.00)
GFR, Estimated: >60 mL/min (ref >60)
Glucose, Bld: 122 mg/dL — ABNORMAL HIGH (ref 70–99)
Potassium: 4.3 mmol/L (ref 3.5–5.1)
Sodium: 135 mmol/L (ref 135–145)
Total Bilirubin: 0.9 mg/dL (ref 0.0–1.2)
Total Protein: 7.1 g/dL (ref 6.5–8.1)

## 2023-10-03 LAB — FERRITIN: Ferritin: 67 ng/mL (ref 11–307)

## 2023-10-05 ENCOUNTER — Other Ambulatory Visit

## 2023-10-10 ENCOUNTER — Telehealth: Payer: Self-pay | Admitting: *Deleted

## 2023-10-10 NOTE — Telephone Encounter (Signed)
 The granddaughter called saying that the last time she got her counts the platelets were 178.  She has been giving her 1 Hydrea  every single day but 2 days a week they add extra on Tuesday and Friday.  Wanted to know from Dr. Melanee because of the number going down really good but it be okay to just do 1 every single day.  Dr. Melanee says yes she can just give them give her 1 a day call the granddaughter and let her know that she only take hydrea  1 da granddaughter is good for th new change.

## 2023-10-13 ENCOUNTER — Other Ambulatory Visit: Payer: Self-pay | Admitting: Oncology

## 2023-10-13 DIAGNOSIS — D473 Essential (hemorrhagic) thrombocythemia: Secondary | ICD-10-CM

## 2023-10-13 MED ORDER — HYDROXYUREA 500 MG PO CAPS: 500.0000 mg | ORAL_CAPSULE | Freq: Every day | ORAL | 1 refills | Status: DC

## 2023-11-09 ENCOUNTER — Other Ambulatory Visit: Payer: Self-pay

## 2023-11-09 DIAGNOSIS — D473 Essential (hemorrhagic) thrombocythemia: Secondary | ICD-10-CM

## 2023-11-10 ENCOUNTER — Inpatient Hospital Stay: Admitting: Oncology

## 2023-11-10 ENCOUNTER — Inpatient Hospital Stay: Attending: Oncology

## 2023-11-10 ENCOUNTER — Encounter: Payer: Self-pay | Admitting: Oncology

## 2023-11-10 VITALS — BP 119/64 | HR 85 | Temp 98.0°F | Resp 18 | Ht 62.0 in | Wt 146.7 lb

## 2023-11-10 DIAGNOSIS — M810 Age-related osteoporosis without current pathological fracture: Secondary | ICD-10-CM | POA: Diagnosis not present

## 2023-11-10 DIAGNOSIS — Z9841 Cataract extraction status, right eye: Secondary | ICD-10-CM | POA: Diagnosis not present

## 2023-11-10 DIAGNOSIS — D473 Essential (hemorrhagic) thrombocythemia: Secondary | ICD-10-CM | POA: Diagnosis not present

## 2023-11-10 DIAGNOSIS — Z880 Allergy status to penicillin: Secondary | ICD-10-CM | POA: Diagnosis not present

## 2023-11-10 DIAGNOSIS — Z881 Allergy status to other antibiotic agents status: Secondary | ICD-10-CM | POA: Insufficient documentation

## 2023-11-10 DIAGNOSIS — I10 Essential (primary) hypertension: Secondary | ICD-10-CM | POA: Diagnosis not present

## 2023-11-10 DIAGNOSIS — Z9049 Acquired absence of other specified parts of digestive tract: Secondary | ICD-10-CM | POA: Insufficient documentation

## 2023-11-10 DIAGNOSIS — Z887 Allergy status to serum and vaccine status: Secondary | ICD-10-CM | POA: Insufficient documentation

## 2023-11-10 DIAGNOSIS — Z809 Family history of malignant neoplasm, unspecified: Secondary | ICD-10-CM | POA: Diagnosis not present

## 2023-11-10 DIAGNOSIS — Z833 Family history of diabetes mellitus: Secondary | ICD-10-CM | POA: Diagnosis not present

## 2023-11-10 DIAGNOSIS — D7589 Other specified diseases of blood and blood-forming organs: Secondary | ICD-10-CM | POA: Diagnosis not present

## 2023-11-10 DIAGNOSIS — Z8673 Personal history of transient ischemic attack (TIA), and cerebral infarction without residual deficits: Secondary | ICD-10-CM | POA: Insufficient documentation

## 2023-11-10 DIAGNOSIS — Z79899 Other long term (current) drug therapy: Secondary | ICD-10-CM

## 2023-11-10 DIAGNOSIS — Z8249 Family history of ischemic heart disease and other diseases of the circulatory system: Secondary | ICD-10-CM | POA: Insufficient documentation

## 2023-11-10 DIAGNOSIS — I2510 Atherosclerotic heart disease of native coronary artery without angina pectoris: Secondary | ICD-10-CM | POA: Insufficient documentation

## 2023-11-10 DIAGNOSIS — Z90710 Acquired absence of both cervix and uterus: Secondary | ICD-10-CM | POA: Diagnosis not present

## 2023-11-10 DIAGNOSIS — Z83511 Family history of glaucoma: Secondary | ICD-10-CM | POA: Insufficient documentation

## 2023-11-10 DIAGNOSIS — Z90721 Acquired absence of ovaries, unilateral: Secondary | ICD-10-CM | POA: Insufficient documentation

## 2023-11-10 DIAGNOSIS — Z9842 Cataract extraction status, left eye: Secondary | ICD-10-CM | POA: Insufficient documentation

## 2023-11-10 DIAGNOSIS — Z7902 Long term (current) use of antithrombotics/antiplatelets: Secondary | ICD-10-CM | POA: Insufficient documentation

## 2023-11-10 DIAGNOSIS — Z882 Allergy status to sulfonamides status: Secondary | ICD-10-CM | POA: Diagnosis not present

## 2023-11-10 LAB — CBC WITH DIFFERENTIAL (CANCER CENTER ONLY)
Abs Immature Granulocytes: 0.04 K/uL (ref 0.00–0.07)
Basophils Absolute: 0.1 K/uL (ref 0.0–0.1)
Basophils Relative: 1 %
Eosinophils Absolute: 0 K/uL (ref 0.0–0.5)
Eosinophils Relative: 1 %
HCT: 39.8 % (ref 36.0–46.0)
Hemoglobin: 13.6 g/dL (ref 12.0–15.0)
Immature Granulocytes: 1 %
Lymphocytes Relative: 17 %
Lymphs Abs: 1.4 K/uL (ref 0.7–4.0)
MCH: 38.2 pg — ABNORMAL HIGH (ref 26.0–34.0)
MCHC: 34.2 g/dL (ref 30.0–36.0)
MCV: 111.8 fL — ABNORMAL HIGH (ref 80.0–100.0)
Monocytes Absolute: 0.7 K/uL (ref 0.1–1.0)
Monocytes Relative: 9 %
Neutro Abs: 5.9 K/uL (ref 1.7–7.7)
Neutrophils Relative %: 71 %
Platelet Count: 214 K/uL (ref 150–400)
RBC: 3.56 MIL/uL — ABNORMAL LOW (ref 3.87–5.11)
RDW: 14.6 % (ref 11.5–15.5)
WBC Count: 8.2 K/uL (ref 4.0–10.5)
nRBC: 0 % (ref 0.0–0.2)

## 2023-11-10 LAB — CMP (CANCER CENTER ONLY)
ALT: 20 U/L (ref 0–44)
AST: 28 U/L (ref 15–41)
Albumin: 4.1 g/dL (ref 3.5–5.0)
Alkaline Phosphatase: 67 U/L (ref 38–126)
Anion gap: 11 (ref 5–15)
BUN: 12 mg/dL (ref 8–23)
CO2: 19 mmol/L — ABNORMAL LOW (ref 22–32)
Calcium: 9 mg/dL (ref 8.9–10.3)
Chloride: 103 mmol/L (ref 98–111)
Creatinine: 0.91 mg/dL (ref 0.44–1.00)
GFR, Estimated: >60 mL/min (ref >60)
Glucose, Bld: 146 mg/dL — ABNORMAL HIGH (ref 70–99)
Potassium: 3.7 mmol/L (ref 3.5–5.1)
Sodium: 133 mmol/L — ABNORMAL LOW (ref 135–145)
Total Bilirubin: 1.1 mg/dL (ref 0.0–1.2)
Total Protein: 7.4 g/dL (ref 6.5–8.1)

## 2023-11-10 LAB — IRON AND TIBC
Iron: 51 ug/dL (ref 28–170)
Saturation Ratios: 14 % (ref 10.4–31.8)
TIBC: 364 ug/dL (ref 250–450)
UIBC: 313 ug/dL

## 2023-11-10 LAB — FERRITIN: Ferritin: 101 ng/mL (ref 11–307)

## 2023-11-10 NOTE — Progress Notes (Signed)
 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  She presented to the ER on 07/16/2023 following a fall and incidentally CBC showed white count of 8.5, H&H of 12.1/37.6 and a platelet count of 926.  In February 2024 her platelet count was 404.  Patient was previously on Plavix  up until September 2024 but each time she started Plavix  for her TIA she began to experience hematuria.  She had a comprehensive hematuria workup which was negative for any acute pathology.  She has therefore remained off Plavix  since September 2024.  She is not presently on any blood thinners.  She lives alone and is independent of her ADLs.    Results of blood work from 20 2025 showed white count of 9.7, H&H of 11/33.6 with a platelet count of 821.  JAK2 mutation testing positive.  BCR-ABL FISH negative.  Von Willebrand panel testing normal.  Ferritin levels low at 28 with an iron saturation of 18% and elevated TIBC of 466.  Patient started Hydrea  500 mg daily in mid June 2025.    Interval history- Meghan Bell is an 83 year old female with essential thrombocytosis who presents for follow-up of her condition.  She has been managing her essential thrombocytosis with Hydrea , taking 500 mg daily since June 2025. Her platelet counts were 761 three months ago and are 214 today. Her white blood cell count is  normal at 8.2, and her hemoglobin is normal at 13.6.  She experiences headaches more frequently, which are managed with Tylenol . She reports no other significant side effects from Hydrea .  ECOG PS- 2 Pain scale- 0   Review of systems- Review of Systems  Constitutional:  Negative for chills, fever, malaise/fatigue and weight loss.  HENT:  Negative for congestion, ear discharge and nosebleeds.   Eyes:  Negative for blurred vision.  Respiratory:  Negative for cough, hemoptysis, sputum production, shortness of breath and wheezing.   Cardiovascular:  Negative for chest pain, palpitations, orthopnea and claudication.  Gastrointestinal:  Negative for abdominal pain, blood in stool, constipation, diarrhea, heartburn, melena, nausea and vomiting.  Genitourinary:  Negative for dysuria, flank pain, frequency, hematuria and urgency.  Musculoskeletal:  Negative for back pain, joint pain and myalgias.  Skin:  Negative for rash.  Neurological:  Negative for dizziness, tingling, focal weakness, seizures, weakness and headaches.  Endo/Heme/Allergies:  Does not bruise/bleed easily.  Psychiatric/Behavioral:  Negative for depression and suicidal ideas. The patient does not have insomnia.       Allergies  Allergen Reactions   Amoxicillin Other (See Comments)    Other reaction(s): Other (See Comments) Other Reaction: GI Upset Other Reaction: GI Upset    Erythromycin Shortness Of Breath and Rash   Influenza Vaccines Rash and Shortness Of Breath   Bacitracin Dermatitis   Elemental Sulfur    Other     Other reaction(s): Other (See Comments), Other (See Comments) Crestor, Lipitor, Pravachol , Zocor   - myalgias Crestor, Lipitor, Pravachol ,  Zocor   - myalgias    Nitrofurantoin Rash    Other reaction(s): Other (See Comments) DIFFICULTY BREATHING and rash    Penicillins Rash   Sulfa Antibiotics Rash     Past Medical History:  Diagnosis Date   Arthritis    B12 deficiency    Brain aneurysm     Cancer (HCC)    SKIN-non-melanoma   Coronary artery disease    Depression    Dyspnea    DOE   Dysrhythmia    GERD (gastroesophageal reflux disease)    Hammer toe of right foot    second toe   Heart murmur    Hypertension    Memory loss    Osteoporosis    Sleep apnea      Past Surgical History:  Procedure Laterality Date   ABDOMINAL HYSTERECTOMY  1972   APPENDECTOMY     BLADDER SURGERY  2011   BRAIN SURGERY     ANEURTSM WITH COIL   BREAST SURGERY Left 2002   LUMPECTOMY   CATARACT EXTRACTION W/PHACO Right 11/21/2017   Procedure: CATARACT EXTRACTION PHACO AND INTRAOCULAR LENS PLACEMENT (IOC);  Surgeon: Jaye Fallow, MD;  Location: [facility] ORS;  Service: Ophthalmology;  Laterality: Right;  US  00:41 CDE 5.29 Fluid pack lot # [id number]   CATARACT EXTRACTION W/PHACO Left 12/19/2017   Procedure: CATARACT EXTRACTION PHACO AND INTRAOCULAR LENS PLACEMENT (IOC);  Surgeon: Jaye Fallow, MD;  Location: [facility] ORS;  Service: Ophthalmology;  Laterality: Left;  US  00:33 CDE 3.94 Fluid pack lot # [id number]   CHOLECYSTECTOMY     FOOT SURGERY     HEMORROIDECTOMY     RCR  2006   SALPINGOOPHORECTOMY     TONSILLECTOMY      Social History   Socioeconomic History   Marital status: Married    Spouse name: Not on file   Number of children: Not on file   Years of education: Not on file   Highest education level: Not on file  Occupational History   Not on file  Tobacco Use   Smoking status: Never   Smokeless tobacco: Never  Vaping Use   Vaping status: Never Used  Substance and Sexual Activity   Alcohol use: Not Currently   Drug use: Never   Sexual activity: Not on file  Other Topics Concern   Not on file  Social History Narrative   Not on file   Social Drivers of Health   Financial Resource Strain: Low Risk  (11/02/2023)   Received from [facility]   Overall Financial Resource Strain (CARDIA)    Difficulty of Paying Living Expenses: Not hard at all   Food Insecurity: No Food Insecurity (11/02/2023)   Received from [facility]   Hunger Vital Sign    Within the past 12 months, you worried that your food would run out before you got the money to buy more.: Never true    Within the past 12 months, the food you bought just didn't last and you didn't have money to get more.: Never true  Transportation Needs: No Transportation Needs (11/02/2023)   Received from [facility] - Transportation    In the past 12 months, has lack of transportation kept you from medical appointments or from getting medications?: No    Lack of Transportation (Non-Medical): No  Physical Activity: Not on file  Stress: Not on file  Social Connections: Not on file  Intimate Partner Violence: Not At Risk (07/27/2023)  Humiliation, Afraid, Rape, and Kick questionnaire    Fear of Current or Ex-Partner: No    Emotionally Abused: No    Physically Abused: No    Sexually Abused: No    Family History  Problem Relation Age of Onset   Glaucoma Mother    Heart disease Mother    Heart disease Father    Migraines Father    Cancer Father    Heart disease Sister    Hypertension Sister    Diabetes Sister    Diabetes Brother      Current Outpatient Medications:    acetaminophen  (TYLENOL ) 325 MG tablet, Take 650 mg by mouth every 6 (six) hours as needed., Disp: , Rfl:    Aspirin  81 MG CAPS, , Disp: , Rfl:    citalopram  (CELEXA ) 10 MG tablet, Take 1 tablet by mouth daily., Disp: , Rfl:    donepezil  (ARICEPT ) 10 MG tablet, Take 10 mg by mouth at bedtime., Disp: , Rfl:    fluticasone (FLONASE) 50 MCG/ACT nasal spray, Place into both nostrils daily., Disp: , Rfl:    hydroxyurea  (HYDREA ) 500 MG capsule, Take 1 capsule (500 mg total) by mouth daily. May take with food to minimize GI side effects., Disp: 30 capsule, Rfl: 0   hydroxyurea  (HYDREA ) 500 MG capsule, Take hydrea  1000 mg (2 capsules) twice a week and 500 mg on other days.   May take with food to minimize GI side effects., Disp: 36 capsule, Rfl: 1   hydroxyurea  (HYDREA ) 500 MG capsule, Take 1 capsule (500 mg total) by mouth daily. May take with food to minimize GI side effects., Disp: 30 capsule, Rfl: 1   lovastatin (MEVACOR) 20 MG tablet, Take 20 mg by mouth at bedtime., Disp: , Rfl:    metoprolol  succinate (TOPROL -XL) 25 MG 24 hr tablet, Take 25 mg by mouth daily., Disp: , Rfl:    nitroGLYCERIN  (NITROSTAT ) 0.4 MG SL tablet, Place 0.4 mg under the tongue every 5 (five) minutes as needed for chest pain., Disp: , Rfl:    pantoprazole  (PROTONIX ) 40 MG tablet, Take 1 tablet (40 mg total) by mouth daily., Disp: 30 tablet, Rfl: 0  Physical exam:  Vitals:   11/10/23 1520  BP: 119/64  Pulse: 85  Resp: 18  Temp: 98 F (36.7 C)  TempSrc: Tympanic  SpO2: 96%  Weight: 146 lb 11.2 oz (66.5 kg)  Height: 5' 2 (1.575 m)   Physical Exam Cardiovascular:     Rate and Rhythm: Normal rate and regular rhythm.     Heart sounds: Normal heart sounds.  Pulmonary:     Effort: Pulmonary effort is normal.     Breath sounds: Normal breath sounds.  Skin:    General: Skin is warm and dry.  Neurological:     Mental Status: She is alert and oriented to person, place, and time.      I have personally reviewed labs listed below:    Latest Ref Rng & Units 10/03/2023    4:30 PM  CMP  Glucose 70 - 99 mg/dL 877   BUN 8 - 23 mg/dL 11   Creatinine 9.55 - 1.00 mg/dL 9.45   Sodium 864 - 854 mmol/L 135   Potassium 3.5 - 5.1 mmol/L 4.3   Chloride 98 - 111 mmol/L 102   CO2 22 - 32 mmol/L 25   Calcium 8.9 - 10.3 mg/dL 8.9   Total Protein 6.5 - 8.1 g/dL 7.1   Total Bilirubin 0.0 - 1.2 mg/dL 0.9   Alkaline  Phos 38 - 126 U/L 58   AST 15 - 41 U/L 24   ALT 0 - 44 U/L 19       Latest Ref Rng & Units 10/03/2023    4:29 PM  CBC  WBC 4.0 - 10.5 K/uL 4.5   Hemoglobin 12.0 - 15.0 g/dL 86.7   Hematocrit 63.9 - 46.0 % 38.9   Platelets 150 - 400 K/uL 178       Assessment and plan-  Patient is a 83 y.o. 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  She does have macrocytosis with the MCV of 111 likely secondary to Hydrea .  Hemoglobin and white cell count are within normal limits.  She will continue Hydrea  andProgression or toxicity.  Also continue low-dose aspirin    81 mg.    CBC CMP in 3 and 6 months and I will see her back in 6 months   Visit Diagnosis 1. High risk medication use   2. Essential thrombocytosis (HCC)      Dr. Annah Skene, MD, MPH [facility] [phone] 11/10/2023 3:01 PM

## 2023-11-10 NOTE — Progress Notes (Signed)
 Patient has no new or acute concerns at this time.

## 2023-12-12 ENCOUNTER — Other Ambulatory Visit: Payer: Self-pay | Admitting: Oncology

## 2023-12-12 DIAGNOSIS — D473 Essential (hemorrhagic) thrombocythemia: Secondary | ICD-10-CM

## 2024-01-09 ENCOUNTER — Other Ambulatory Visit: Payer: Self-pay | Admitting: Oncology

## 2024-01-09 DIAGNOSIS — D473 Essential (hemorrhagic) thrombocythemia: Secondary | ICD-10-CM

## 2024-02-09 ENCOUNTER — Inpatient Hospital Stay: Attending: Oncology

## 2024-02-09 DIAGNOSIS — D473 Essential (hemorrhagic) thrombocythemia: Secondary | ICD-10-CM | POA: Diagnosis present

## 2024-02-09 DIAGNOSIS — Z79899 Other long term (current) drug therapy: Secondary | ICD-10-CM | POA: Insufficient documentation

## 2024-02-09 LAB — CMP (CANCER CENTER ONLY)
ALT: 29 U/L (ref 0–44)
AST: 30 U/L (ref 15–41)
Albumin: 4.2 g/dL (ref 3.5–5.0)
Alkaline Phosphatase: 79 U/L (ref 38–126)
Anion gap: 14 (ref 5–15)
BUN: 9 mg/dL (ref 8–23)
CO2: 22 mmol/L (ref 22–32)
Calcium: 9.3 mg/dL (ref 8.9–10.3)
Chloride: 106 mmol/L (ref 98–111)
Creatinine: 0.72 mg/dL (ref 0.44–1.00)
GFR, Estimated: >60 mL/min
Glucose, Bld: 179 mg/dL — ABNORMAL HIGH (ref 70–99)
Potassium: 3.7 mmol/L (ref 3.5–5.1)
Sodium: 141 mmol/L (ref 135–145)
Total Bilirubin: 0.7 mg/dL (ref 0.0–1.2)
Total Protein: 6.7 g/dL (ref 6.5–8.1)

## 2024-02-09 LAB — CBC WITH DIFFERENTIAL (CANCER CENTER ONLY)
Abs Immature Granulocytes: 0.02 K/uL (ref 0.00–0.07)
Basophils Absolute: 0.1 K/uL (ref 0.0–0.1)
Basophils Relative: 1 %
Eosinophils Absolute: 0 K/uL (ref 0.0–0.5)
Eosinophils Relative: 1 %
HCT: 39 % (ref 36.0–46.0)
Hemoglobin: 13.2 g/dL (ref 12.0–15.0)
Immature Granulocytes: 0 %
Lymphocytes Relative: 37 %
Lymphs Abs: 1.7 K/uL (ref 0.7–4.0)
MCH: 38.5 pg — ABNORMAL HIGH (ref 26.0–34.0)
MCHC: 33.8 g/dL (ref 30.0–36.0)
MCV: 113.7 fL — ABNORMAL HIGH (ref 80.0–100.0)
Monocytes Absolute: 0.4 K/uL (ref 0.1–1.0)
Monocytes Relative: 8 %
Neutro Abs: 2.3 K/uL (ref 1.7–7.7)
Neutrophils Relative %: 53 %
Platelet Count: 204 K/uL (ref 150–400)
RBC: 3.43 MIL/uL — ABNORMAL LOW (ref 3.87–5.11)
RDW: 13 % (ref 11.5–15.5)
Smear Review: NORMAL
WBC Count: 4.5 K/uL (ref 4.0–10.5)
nRBC: 0 % (ref 0.0–0.2)

## 2024-05-17 ENCOUNTER — Ambulatory Visit: Admitting: Oncology

## 2024-05-17 ENCOUNTER — Other Ambulatory Visit
